# Patient Record
Sex: Female | Born: 1965 | Race: White | Hispanic: Yes | Marital: Single | Smoking: Current every day smoker
Health system: Southern US, Community
[De-identification: ages and names within clinical notes are randomized; demographics above are authoritative.]

## PROBLEM LIST (undated history)

## (undated) DIAGNOSIS — E109 Type 1 diabetes mellitus without complications: Secondary | ICD-10-CM

## (undated) DIAGNOSIS — D8989 Other specified disorders involving the immune mechanism, not elsewhere classified: Secondary | ICD-10-CM

## (undated) DIAGNOSIS — E119 Type 2 diabetes mellitus without complications: Secondary | ICD-10-CM

## (undated) DIAGNOSIS — D649 Anemia, unspecified: Secondary | ICD-10-CM

## (undated) DIAGNOSIS — R609 Edema, unspecified: Secondary | ICD-10-CM

## (undated) DIAGNOSIS — K5190 Ulcerative colitis, unspecified, without complications: Secondary | ICD-10-CM

## (undated) DIAGNOSIS — Z803 Family history of malignant neoplasm of breast: Secondary | ICD-10-CM

## (undated) DIAGNOSIS — G629 Polyneuropathy, unspecified: Secondary | ICD-10-CM

## (undated) DIAGNOSIS — E785 Hyperlipidemia, unspecified: Secondary | ICD-10-CM

## (undated) DIAGNOSIS — I10 Essential (primary) hypertension: Secondary | ICD-10-CM

## (undated) DIAGNOSIS — R600 Localized edema: Secondary | ICD-10-CM

## (undated) DIAGNOSIS — Z8481 Family history of carrier of genetic disease: Secondary | ICD-10-CM

## (undated) HISTORY — DX: Edema, unspecified: R60.9

## (undated) HISTORY — DX: Family history of carrier of genetic disease: Z84.81

## (undated) HISTORY — DX: Localized edema: R60.0

## (undated) HISTORY — DX: Anemia, unspecified: D64.9

## (undated) HISTORY — PX: CHOLECYSTECTOMY: SHX55

## (undated) HISTORY — DX: Morbid (severe) obesity due to excess calories: E66.01

## (undated) HISTORY — DX: Ulcerative colitis, unspecified, without complications: K51.90

## (undated) HISTORY — DX: Hyperlipidemia, unspecified: E78.5

## (undated) HISTORY — DX: Family history of malignant neoplasm of breast: Z80.3

## (undated) HISTORY — DX: Type 1 diabetes mellitus without complications: E10.9

## (undated) HISTORY — DX: Type 2 diabetes mellitus without complications: E11.9

## (undated) HISTORY — DX: Essential (primary) hypertension: I10

## (undated) HISTORY — DX: Polyneuropathy, unspecified: G62.9

## (undated) HISTORY — DX: Other specified disorders involving the immune mechanism, not elsewhere classified: D89.89

---

## 2004-07-11 ENCOUNTER — Ambulatory Visit: Payer: Self-pay | Admitting: Family Medicine

## 2006-11-13 ENCOUNTER — Emergency Department (HOSPITAL_COMMUNITY): Admission: EM | Admit: 2006-11-13 | Discharge: 2006-11-13 | Payer: Self-pay | Admitting: Emergency Medicine

## 2007-08-28 ENCOUNTER — Emergency Department (HOSPITAL_COMMUNITY): Admission: EM | Admit: 2007-08-28 | Discharge: 2007-08-28 | Payer: Self-pay | Admitting: Emergency Medicine

## 2009-11-21 ENCOUNTER — Encounter: Admission: RE | Admit: 2009-11-21 | Discharge: 2009-11-21 | Payer: Self-pay | Admitting: Internal Medicine

## 2010-10-19 LAB — COMPREHENSIVE METABOLIC PANEL
ALT: 13
AST: 17
Albumin: 3.4 — ABNORMAL LOW
Alkaline Phosphatase: 92
BUN: 8
CO2: 25
Calcium: 8.9
Chloride: 102
Creatinine, Ser: 0.92
GFR calc Af Amer: >60
GFR calc non Af Amer: >60
Glucose, Bld: 205 — ABNORMAL HIGH
Potassium: 4.5
Sodium: 138
Total Bilirubin: 0.6
Total Protein: 7.7

## 2010-10-19 LAB — HEPATIC FUNCTION PANEL
ALT: 13
AST: 15
Albumin: 3.2 — ABNORMAL LOW
Alkaline Phosphatase: 88
Bilirubin, Direct: 0.1
Indirect Bilirubin: 0.5
Total Bilirubin: 0.6
Total Protein: 7.5

## 2010-10-19 LAB — DIFFERENTIAL
Basophils Absolute: 0.1
Basophils Relative: 0
Eosinophils Absolute: 0.1
Eosinophils Relative: 1
Lymphocytes Relative: 13
Lymphs Abs: 3
Monocytes Absolute: 0.6
Monocytes Relative: 3
Neutro Abs: 19.1 — ABNORMAL HIGH
Neutrophils Relative %: 84 — ABNORMAL HIGH

## 2010-10-19 LAB — CBC
HCT: 40.9
Hemoglobin: 13.4
MCHC: 32.7
MCV: 81.7
Platelets: 537 — ABNORMAL HIGH
RBC: 5.01
RDW: 15.4
WBC: 22.9 — ABNORMAL HIGH

## 2010-10-19 LAB — SALICYLATE LEVEL: Salicylate Lvl: <4

## 2010-10-19 LAB — POCT CARDIAC MARKERS
CKMB, poc: 2.6
Myoglobin, poc: 169
Troponin i, poc: <0.05

## 2010-10-19 LAB — ACETAMINOPHEN LEVEL
Acetaminophen (Tylenol), Serum: 12.7
Acetaminophen (Tylenol), Serum: <10 — ABNORMAL LOW

## 2010-10-31 LAB — I-STAT 8, (EC8 V) (CONVERTED LAB)
Acid-base deficit: 1
BUN: 6
Bicarbonate: 25.2 — ABNORMAL HIGH
Chloride: 105
Glucose, Bld: 144 — ABNORMAL HIGH
HCT: 41
Hemoglobin: 13.9
Operator id: 146091
Potassium: 4
Sodium: 137
TCO2: 27
pCO2, Ven: 44.6 — ABNORMAL LOW
pH, Ven: 7.361 — ABNORMAL HIGH

## 2010-10-31 LAB — POCT I-STAT CREATININE
Creatinine, Ser: 0.8
Operator id: 146091

## 2010-10-31 LAB — D-DIMER, QUANTITATIVE: D-Dimer, Quant: 0.97 — ABNORMAL HIGH

## 2013-11-04 ENCOUNTER — Ambulatory Visit (INDEPENDENT_AMBULATORY_CARE_PROVIDER_SITE_OTHER): Payer: Medicaid Other

## 2013-11-04 VITALS — BP 105/60 | HR 89 | Resp 12

## 2013-11-04 DIAGNOSIS — L97511 Non-pressure chronic ulcer of other part of right foot limited to breakdown of skin: Secondary | ICD-10-CM

## 2013-11-04 DIAGNOSIS — E1169 Type 2 diabetes mellitus with other specified complication: Secondary | ICD-10-CM | POA: Insufficient documentation

## 2013-11-04 DIAGNOSIS — E1140 Type 2 diabetes mellitus with diabetic neuropathy, unspecified: Secondary | ICD-10-CM

## 2013-11-04 DIAGNOSIS — E1149 Type 2 diabetes mellitus with other diabetic neurological complication: Secondary | ICD-10-CM

## 2013-11-04 DIAGNOSIS — B351 Tinea unguium: Secondary | ICD-10-CM

## 2013-11-04 DIAGNOSIS — M79676 Pain in unspecified toe(s): Secondary | ICD-10-CM

## 2013-11-04 DIAGNOSIS — E785 Hyperlipidemia, unspecified: Secondary | ICD-10-CM

## 2013-11-04 DIAGNOSIS — I10 Essential (primary) hypertension: Secondary | ICD-10-CM

## 2013-11-04 NOTE — Patient Instructions (Signed)
ANTIBACTERIAL SOAP INSTRUCTIONS  THE DAY AFTER PROCEDURE  Please follow the instructions your doctor has marked.   Shower as usual. Before getting out, place a drop of antibacterial liquid soap (Dial) on a wet, clean washcloth.  Gently wipe washcloth over affected area.  Afterward, rinse the area with warm water.  Blot the area dry with a soft cloth and cover with antibiotic ointment (neosporin, polysporin, bacitracin) and band aid or gauze and tape  Place 3-4 drops of antibacterial liquid soap in a quart of warm tap water.  Submerge foot into water for 20 minutes.  If bandage was applied after your procedure, leave on to allow for easy lift off, then remove and continue with soak for the remaining time.  Next, blot area dry with a soft cloth and cover with a bandage.  Apply other medications as directed by your doctor, such as cortisporin otic solution (eardrops) or neosporin antibiotic ointment  Daily cleansing of wound site with soap and water as instructed dry thoroughly apply Silvadene and Band-Aid or gauze dressing daily repeat cleansing and dressing change and Silvadene application daily until the wound has resolved

## 2013-11-04 NOTE — Progress Notes (Signed)
Subjective:    Patient ID: Angela ChimeMichelle R Exley, female    DOB: 1965-03-06, 48 y.o.   MRN: [id number]  HPI  PT STATED B/L UNDER THE GREAT TOE HAVE BLISTER FOR 1 WEEK. THE TOES ARE BEEN THE SAME AND DO NOT HAVE ANY FEELING ON BOTH FEET. TRIED TO USED NEOSPORIN BUT NO HELP.  ALSO TOENAILS HAVE DISCOLORATION/ THICK AND THEY GET SORE.  Review of Systems  HENT: Negative.   Eyes: Negative.   Respiratory: Negative.   Cardiovascular: Positive for leg swelling.  Gastrointestinal: Negative.   Endocrine: Positive for polyuria.  Musculoskeletal: Positive for arthralgias and gait problem.  Skin: Positive for wound.  Allergic/Immunologic: Negative.   Neurological: Positive for weakness and numbness.  Hematological: Negative.   Psychiatric/Behavioral: Negative.        Objective:   Physical Exam  Constitutional: She is oriented to person, place, and time. She appears well-developed and well-nourished.  Cardiovascular: Normal rate.   Musculoskeletal: She exhibits edema.  Neurological: She is alert and oriented to person, place, and time. She has normal reflexes.  Skin: Skin is warm.  Psychiatric: She has a normal mood and affect. Her behavior is normal. Judgment and thought content normal.   lower extremity findings as follows vascular status is diminished with thready DP pulses plus one over 4 bilateral PT nonpalpable bilateral +1 edema bilateral notable varicosities noted neurologically epicritic and proprioceptive sensations grossly diminished absent vibratory sensation the forefoot bilateral absent epicritic sensation in her foot dorsum and ankle area all 10 sites are absent on [facility] there is normal plantar response DTRs not elicited dermatologically skin color pigment normal hair growth absent nails criptotic incurvated friable brittle crumbly discolored and tender both on palpation and angulation. Hallux IP joints bilateral reveal a hemorrhage a keratoses which on debridement of the  left hallux IP joint the ulcer or keratoses is healed beneath no active ulcers noted however there is active ulcer on the right hallux IP joint following debridement. Patient is multiple keratoses sub-5 bilateral as well as the hallux IP joints. Orthopedic biomechanical exam rectus foot type ankle mid tarsus subtalar joint motion is normal semirigid digital contractures are noted there again dermatologically criptotic incurvated friable problem brittle discolored nails 1 through 5 bilateral.        Assessment & Plan:  Assessment this time diabetes with history peripheral neuropathy and angiopathy there is some vascular cover minus patient does have possible symptoms of claudication to walk a certain distance may want to address this with vascular evaluation in the near future there is no acute issue right now however if no healing of the ulcer within the next month again vascular recommendations for evaluation we recommended at this time patient is placed on a regimen of ulcer debridement of the right hallux Silvadene and gauze dressing applied daily cleansing and Silvadene dressing changes are recommended no oral antibiotic and needed there is no ascending size lymphangitis. Nails thick brittle crumbly friable dystrophic in findings his with onychomycosis mycotic nails debrided x10 return for future palliative care is needed there is some ecchymosis of the nail plates nailbeds of the hallux and lesser digits on the left likely associated with her shoe wear she is now wearing socks wearing slip on shoes and she is biting into the end of her shoes with her toes. This time recommendation patient is a candidate for diabetic extra-depth shoes in the interim socks and shoes or socks and slippers are recommended at all times consider Crocs for around  the house. Recheck in 4 weeks for reevaluation of ulcer and future diabetic foot care is needed  Alvan Dameichard Roshaunda Starkey DPM

## 2013-11-23 ENCOUNTER — Encounter: Payer: Self-pay | Admitting: Vascular Surgery

## 2013-11-23 ENCOUNTER — Other Ambulatory Visit: Payer: Self-pay | Admitting: *Deleted

## 2013-11-23 DIAGNOSIS — R0989 Other specified symptoms and signs involving the circulatory and respiratory systems: Secondary | ICD-10-CM

## 2013-11-23 DIAGNOSIS — L97419 Non-pressure chronic ulcer of right heel and midfoot with unspecified severity: Secondary | ICD-10-CM

## 2013-12-02 ENCOUNTER — Ambulatory Visit: Payer: Medicaid Other

## 2013-12-07 ENCOUNTER — Encounter: Payer: Self-pay | Admitting: Vascular Surgery

## 2013-12-08 ENCOUNTER — Encounter: Payer: Medicaid Other | Admitting: Vascular Surgery

## 2013-12-08 ENCOUNTER — Encounter (HOSPITAL_COMMUNITY): Payer: Medicaid Other

## 2013-12-20 ENCOUNTER — Ambulatory Visit (INDEPENDENT_AMBULATORY_CARE_PROVIDER_SITE_OTHER): Payer: Medicaid Other

## 2013-12-20 VITALS — BP 117/66 | HR 92 | Resp 12

## 2013-12-20 DIAGNOSIS — M79676 Pain in unspecified toe(s): Secondary | ICD-10-CM

## 2013-12-20 DIAGNOSIS — E1149 Type 2 diabetes mellitus with other diabetic neurological complication: Secondary | ICD-10-CM

## 2013-12-20 DIAGNOSIS — L97511 Non-pressure chronic ulcer of other part of right foot limited to breakdown of skin: Secondary | ICD-10-CM

## 2013-12-20 DIAGNOSIS — Q828 Other specified congenital malformations of skin: Secondary | ICD-10-CM

## 2013-12-20 DIAGNOSIS — E1140 Type 2 diabetes mellitus with diabetic neuropathy, unspecified: Secondary | ICD-10-CM

## 2013-12-20 NOTE — Progress Notes (Signed)
   Subjective:    Patient ID: Angela Hubbard, female    DOB: [birth year], 48 y.o.   MRN: [id number]  HPI  ''B/L BOTTOM OF THE GREAT TOE BLISTER SKIN GETTING THICKER AND LOOKING WORSE.''  Review of Systems no new findings or systemic changes noted    Objective:   Physical Exam lower extremity objective findings unchanged pedal pulses are palpable there is hemorrhage a keratosis under the hallux IP joints bilateral however on debridement there is minimal pinpoint bleeding on both sides under the hallux IP joint less than 2 mm in overall diameter in each down to dermal level only no purulence no active sign of infection no discharge or drainage are noted. Again pedal pulses are palpable still waiting on diabetic shoes from [employer]. 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 DP plus one over 4 PT thready nonpalpable +1 edema noted x-rays of foot looks and she fairly greatly improved will recheck again in 6 weeks for follow-up and should have her diabetic extra-depth shoes and insoles bilateral time though should be helpful and offloading these pressure areas. Also will use her goal Bond diabetic cream twice daily as instructed  Alvan Dameichard Sade Mehlhoff DPM

## 2013-12-20 NOTE — Patient Instructions (Signed)
Diabetes and Foot Care Diabetes may cause you to have problems because of poor blood supply (circulation) to your feet and legs. This may cause the skin on your feet to become thinner, break easier, and heal more slowly. Your skin may become dry, and the skin may peel and crack. You may also have nerve damage in your legs and feet causing decreased feeling in them. You may not notice minor injuries to your feet that could lead to infections or more serious problems. Taking care of your feet is one of the most important things you can do for yourself.  HOME CARE INSTRUCTIONS  Wear shoes at all times, even in the house. Do not go barefoot. Bare feet are easily injured.  Check your feet daily for blisters, cuts, and redness. If you cannot see the bottom of your feet, use a mirror or ask someone for help.  Wash your feet with warm water (do not use hot water) and mild soap. Then pat your feet and the areas between your toes until they are completely dry. Do not soak your feet as this can dry your skin.  Apply a moisturizing lotion or petroleum jelly (that does not contain alcohol and is unscented) to the skin on your feet and to dry, brittle toenails. Do not apply lotion between your toes.  Trim your toenails straight across. Do not dig under them or around the cuticle. File the edges of your nails with an emery board or nail file.  Do not cut corns or calluses or try to remove them with medicine.  Wear clean socks or stockings every day. Make sure they are not too tight. Do not wear knee-high stockings since they may decrease blood flow to your legs.  Wear shoes that fit properly and have enough cushioning. To break in new shoes, wear them for just a few hours a day. This prevents you from injuring your feet. Always look in your shoes before you put them on to be sure there are no objects inside.  Do not cross your legs. This may decrease the blood flow to your feet.  If you find a minor scrape,  cut, or break in the skin on your feet, keep it and the skin around it clean and dry. These areas may be cleansed with mild soap and water. Do not cleanse the area with peroxide, alcohol, or iodine.  When you remove an adhesive bandage, be sure not to damage the skin around it.  If you have a wound, look at it several times a day to make sure it is healing.  Do not use heating pads or hot water bottles. They may burn your skin. If you have lost feeling in your feet or legs, you may not know it is happening until it is too late.  Make sure your health care provider performs a complete foot exam at least annually or more often if you have foot problems. Report any cuts, sores, or bruises to your health care provider immediately. SEEK MEDICAL CARE IF:   You have an injury that is not healing.  You have cuts or breaks in the skin.  You have an ingrown nail.  You notice redness on your legs or feet.  You feel burning or tingling in your legs or feet.  You have pain or cramps in your legs and feet.  Your legs or feet are numb.  Your feet always feel cold. SEEK IMMEDIATE MEDICAL CARE IF:   There is increasing redness,   swelling, or pain in or around a wound.  There is a red line that goes up your leg.  Pus is coming from a wound.  You develop a fever or as directed by your health care provider.  You notice a bad smell coming from an ulcer or wound. Document Released: 01/05/2000 Document Revised: 09/09/2012 Document Reviewed: 06/16/2012 [facility] Patient Information 2015 [employer] [place]. This information is not intended to replace advice given to you by your health care provider. Make sure you discuss any questions you have with your health care provider.    Apply lotion to feet twice daily as instructed

## 2014-01-06 ENCOUNTER — Encounter: Payer: Self-pay | Admitting: Vascular Surgery

## 2014-01-06 ENCOUNTER — Telehealth: Payer: Self-pay

## 2014-01-06 NOTE — Telephone Encounter (Signed)
Debra from [employer] called about this patient she needs some type of medicaid form sent to her

## 2014-01-07 ENCOUNTER — Encounter: Payer: Medicaid Other | Admitting: Vascular Surgery

## 2014-01-07 ENCOUNTER — Encounter (HOSPITAL_COMMUNITY): Payer: Medicaid Other

## 2014-01-07 NOTE — Telephone Encounter (Signed)
PT NEED SOME TYPE OF MEDICAID FORM TO [employer]

## 2014-01-19 ENCOUNTER — Encounter: Payer: Medicaid Other | Admitting: Vascular Surgery

## 2014-01-19 ENCOUNTER — Encounter (HOSPITAL_COMMUNITY): Payer: Medicaid Other

## 2014-01-31 ENCOUNTER — Ambulatory Visit: Payer: Medicaid Other

## 2014-02-02 ENCOUNTER — Encounter: Payer: Medicaid Other | Admitting: Vascular Surgery

## 2014-02-02 ENCOUNTER — Encounter (HOSPITAL_COMMUNITY): Payer: Medicaid Other

## 2014-02-07 ENCOUNTER — Encounter (HOSPITAL_COMMUNITY): Payer: Medicaid Other

## 2014-02-07 ENCOUNTER — Encounter: Payer: Medicaid Other | Admitting: Surgery

## 2014-02-23 ENCOUNTER — Encounter: Payer: Self-pay | Admitting: Vascular Surgery

## 2014-02-25 ENCOUNTER — Encounter: Payer: Self-pay | Admitting: Vascular Surgery

## 2014-02-25 ENCOUNTER — Ambulatory Visit (HOSPITAL_COMMUNITY)
Admission: RE | Admit: 2014-02-25 | Discharge: 2014-02-25 | Disposition: A | Discharge status: Home / Self Care | Payer: Medicaid Other | Source: Ambulatory Visit | Attending: Vascular Surgery | Admitting: Vascular Surgery

## 2014-02-25 ENCOUNTER — Ambulatory Visit (INDEPENDENT_AMBULATORY_CARE_PROVIDER_SITE_OTHER): Payer: Medicaid Other | Admitting: Vascular Surgery

## 2014-02-25 VITALS — BP 128/64 | HR 89 | Ht 68.0 in | Wt 393.0 lb

## 2014-02-25 DIAGNOSIS — E119 Type 2 diabetes mellitus without complications: Secondary | ICD-10-CM | POA: Insufficient documentation

## 2014-02-25 DIAGNOSIS — L97419 Non-pressure chronic ulcer of right heel and midfoot with unspecified severity: Secondary | ICD-10-CM

## 2014-02-25 DIAGNOSIS — L84 Corns and callosities: Secondary | ICD-10-CM

## 2014-02-25 DIAGNOSIS — R0989 Other specified symptoms and signs involving the circulatory and respiratory systems: Secondary | ICD-10-CM | POA: Diagnosis present

## 2014-02-25 DIAGNOSIS — I10 Essential (primary) hypertension: Secondary | ICD-10-CM | POA: Insufficient documentation

## 2014-02-25 NOTE — Progress Notes (Signed)
Referred by:  Cain Saupe, MD [address] [place], [place] [address]  Reason for referral: ulcers in feet  History of Present Illness  Angela Hubbard is a 49 y.o. ([birth year]) female who presents with chief complaint: "was told I have no pulses."  Pt was referred to Podiatry for evaluation and reported podiatry felt she had PAD.  Her "ulcers" are calluses per the patient.  She is getting those periodically debulked.  She denies intermittent claudication or rest pain.  Her ambulation is limited.  She does note bilateral leg swelling.  She has mild CVI sx and has not tried compression stockings.  She has no family history of venous disorders.  She has had children in the past and denies DVT history.   Past Medical History  Diagnosis Date  . Diabetes mellitus without complication   . Neuropathy   . Hypertension   . Hyperlipidemia   . Morbid obesity   . Peripheral edema     chronic  . Anemia     Past Surgical History  Procedure Laterality Date  . Cholecystectomy      History   Social History  . Marital Status: Unknown    Spouse Name: N/A    Number of Children: N/A  . Years of Education: N/A   Occupational History  . Not on file.   Social History Main Topics  . Smoking status: Current Every Day Smoker -- 0.50 packs/day for 30 years    Types: Cigarettes  . Smokeless tobacco: Not on file  . Alcohol Use: No  . Drug Use: No  . Sexual Activity: Not on file   Other Topics Concern  . Not on file   Social History Narrative    Family History  Problem Relation Age of Onset  . Hypertension Father   . Diabetes Father   . Hyperlipidemia Father   . Peripheral vascular disease Father     Current Outpatient Prescriptions on File Prior to Visit  Medication Sig Dispense Refill  . albuterol (PROVENTIL HFA;VENTOLIN HFA) 108 (90 BASE) MCG/ACT inhaler Inhale into the lungs every 6 (six) hours as needed for wheezing or shortness of breath.    Marland Kitchen amitriptyline  (ELAVIL) 50 MG tablet Take 50 mg by mouth at bedtime.    Marland Kitchen aspirin 81 MG tablet Take 81 mg by mouth daily.    Marland Kitchen atorvastatin (LIPITOR) 80 MG tablet Take 80 mg by mouth daily.    . furosemide (LASIX) 40 MG tablet Take 40 mg by mouth.    Marland Kitchen gemfibrozil (LOPID) 600 MG tablet Take 600 mg by mouth 2 (two) times daily before a meal.    . HYDROcodone-acetaminophen (NORCO) 7.5-325 MG per tablet Take 1 tablet by mouth every 6 (six) hours as needed for moderate pain.    Marland Kitchen insulin NPH-regular Human (NOVOLIN 70/30) (70-30) 100 UNIT/ML injection Inject into the skin.    Marland Kitchen levothyroxine (SYNTHROID, LEVOTHROID) 50 MCG tablet Take 50 mcg by mouth daily before breakfast.    . lisinopril (PRINIVIL,ZESTRIL) 20 MG tablet Take 20 mg by mouth daily.    . metFORMIN (GLUCOPHAGE) 1000 MG tablet Take 1,000 mg by mouth 2 (two) times daily with a meal.    . metoprolol succinate (TOPROL-XL) 25 MG 24 hr tablet Take 25 mg by mouth daily.    . Multiple Vitamin (MULTI VITAMIN DAILY PO) Take by mouth.    Marland Kitchen omeprazole (PRILOSEC) 40 MG capsule Take 40 mg by mouth daily.    . pregabalin (  LYRICA) 150 MG capsule Take 150 mg by mouth 2 (two) times daily.     No current facility-administered medications on file prior to visit.    No Known Allergies  REVIEW OF SYSTEMS:  (Positives checked otherwise negative)  CARDIOVASCULAR:   chest pain,  chest pressure,  palpitations,  shortness of breath when laying flat,  shortness of breath with exertion,    pain in feet when walking,  pain in feet when laying flat,  history of blood clot in veins (DVT),  history of phlebitis,  swelling in legs,  varicose veins  PULMONARY:   productive cough,  asthma,  wheezing  NEUROLOGIC:   weakness in arms or legs,  numbness in arms or legs,  difficulty speaking or slurred speech,  temporary loss of vision in one eye,  dizziness  HEMATOLOGIC:   bleeding problems,  problems with blood clotting  too easily  MUSCULOSKEL:   joint pain,  joint swelling  GASTROINTEST:    Vomiting blood,   Blood in stool     GENITOURINARY:    Burning with urination,   Blood in urine  PSYCHIATRIC:   history of major depression  INTEGUMENTARY:   rashes,  ulcers  CONSTITUTIONAL:   fever,  chills  For VQI Use Only   PRE-ADM LIVING: Home  AMB STATUS: Ambulatory  CAD Sx: None  PRIOR CHF: None  STRESS TEST:  No,  Normal,  + ischemia,  + MI,  Both   Physical Examination Filed Vitals:   02/25/14 0938  BP: 128/64  Pulse: 89  Height:  (1.727 m)  Weight: 393 lb (178.264 kg)  SpO2: 98%   Body mass index is 59.77 kg/(m^2).  General: A&O x 3, WDWN, morbidly obese  Head: [place]/AT  Ear/Nose/Throat: Hearing grossly intact, nares w/o erythema or drainage, oropharynx w/o Erythema/Exudate  Eyes: PERRLA, EOMI  Neck: Supple, no nuchal rigidity, no palpable LAD  Pulmonary: Sym exp, good air movt, CTAB, no rales, rhonchi, & wheezing  Cardiac: RRR, Nl S1, S2, no Murmurs, rubs or gallops  Vascular: Vessel Right Left  Radial Palpable Palpable  Brachial  Palpable Palpable  Carotid Palpable, without bruit Palpable, without bruit  Aorta Not palpable due to obesity N/A  Femoral Not Palpable due to pannus overlying femorals Not Palpable due to pannus overlying femorals  Popliteal Not palpable Not palpable  PT Faintly Palpable Faintly Palpable  DP Palpable Palpable   Gastrointestinal: soft, NTND, -G/R, - HSM, - masses, - CVAT B, large pannus  Musculoskeletal: M/S 5/5 throughout , Extremities without ischemic changes , lipedema fat distribution, calluses overly great proximal phlanage/MT joint  Neurologic: CN 2-12 intact , Pain and light touch intact in extremities , Motor exam as listed above  Psychiatric: Judgment intact, Mood & affect appropriatefor pt's clinical situation  Dermatologic: See M/S exam for extremity exam, no rashes otherwise  noted  Lymph : No Cervical, Axillary, or Inguinal lymphadenopathy   Non-Invasive Vascular Imaging  ABI (Date: 02/25/2014)  R: 1.11, DP: tri, PT: tri, TBI: 1.04  L: 1.04, DP: tri, PT: tri, TBI: 1.12  Outside Studies/Documentation 4 pages of outside documents were reviewed including: outpatient PCP chart.  Medical Decision Making  Angela Hubbard is a 49 y.o. female who presents with: B calluses, no evidence of PAD, likely mild CVI   This  patient has no evidence of PAD currently though she definitely has risk factorsfor such. She does not have any ulcers or ischemic lesions.  I doubt this patient can fit into 20-30 mm Hg compression stockings so I would stick with OTC stockings.  Obvious emphasis on dietary issues are going to be essential to avoid development of atherosclerotic sequelae since her morbidity obesity is going to make any intervention very difficult.  Thank you for allowing us to participate in this patient's care.  Leonides SakeBrian Mairen Wallenstein, MD [facility] Office: [phone] Pager: [phone]  02/25/2014, 9:55 AM

## 2015-05-17 ENCOUNTER — Ambulatory Visit: Payer: Medicaid Other | Admitting: Sports Medicine

## 2015-05-18 ENCOUNTER — Ambulatory Visit: Payer: Medicaid Other | Admitting: Sports Medicine

## 2015-08-24 DIAGNOSIS — E7800 Pure hypercholesterolemia, unspecified: Secondary | ICD-10-CM | POA: Insufficient documentation

## 2015-08-24 DIAGNOSIS — K219 Gastro-esophageal reflux disease without esophagitis: Secondary | ICD-10-CM | POA: Insufficient documentation

## 2015-08-26 ENCOUNTER — Other Ambulatory Visit: Payer: Self-pay | Admitting: Surgical Oncology

## 2015-08-26 DIAGNOSIS — I159 Secondary hypertension, unspecified: Secondary | ICD-10-CM

## 2015-09-12 ENCOUNTER — Ambulatory Visit
Admission: RE | Admit: 2015-09-12 | Discharge: 2015-09-12 | Disposition: A | Discharge status: Home / Self Care | Payer: Medicaid Other | Source: Ambulatory Visit | Attending: Surgical Oncology | Admitting: Surgical Oncology

## 2015-09-12 DIAGNOSIS — I159 Secondary hypertension, unspecified: Secondary | ICD-10-CM

## 2015-12-18 DIAGNOSIS — Z9884 Bariatric surgery status: Secondary | ICD-10-CM | POA: Insufficient documentation

## 2016-11-05 ENCOUNTER — Other Ambulatory Visit: Payer: Self-pay | Admitting: Family Medicine

## 2016-11-05 ENCOUNTER — Other Ambulatory Visit (HOSPITAL_COMMUNITY)
Admission: RE | Admit: 2016-11-05 | Discharge: 2016-11-05 | Disposition: A | Discharge status: Home / Self Care | Payer: Medicaid Other | Source: Ambulatory Visit | Attending: Family Medicine | Admitting: Family Medicine

## 2016-11-05 DIAGNOSIS — Z124 Encounter for screening for malignant neoplasm of cervix: Secondary | ICD-10-CM | POA: Insufficient documentation

## 2016-11-06 ENCOUNTER — Other Ambulatory Visit: Payer: Self-pay | Admitting: Family Medicine

## 2016-11-06 DIAGNOSIS — N6310 Unspecified lump in the right breast, unspecified quadrant: Secondary | ICD-10-CM

## 2016-11-06 DIAGNOSIS — R102 Pelvic and perineal pain: Secondary | ICD-10-CM

## 2016-11-07 LAB — CYTOLOGY - PAP: Diagnosis: NEGATIVE

## 2016-11-12 ENCOUNTER — Ambulatory Visit
Admission: RE | Admit: 2016-11-12 | Discharge: 2016-11-12 | Disposition: A | Discharge status: Home / Self Care | Payer: Medicaid Other | Source: Ambulatory Visit | Attending: Family Medicine | Admitting: Family Medicine

## 2016-11-12 DIAGNOSIS — R102 Pelvic and perineal pain: Secondary | ICD-10-CM

## 2016-11-13 ENCOUNTER — Ambulatory Visit
Admission: RE | Admit: 2016-11-13 | Discharge: 2016-11-13 | Disposition: A | Discharge status: Home / Self Care | Payer: Medicaid Other | Source: Ambulatory Visit | Attending: Family Medicine | Admitting: Family Medicine

## 2016-11-13 ENCOUNTER — Other Ambulatory Visit: Payer: Self-pay | Admitting: Family Medicine

## 2016-11-13 DIAGNOSIS — N6310 Unspecified lump in the right breast, unspecified quadrant: Secondary | ICD-10-CM

## 2016-11-22 ENCOUNTER — Other Ambulatory Visit: Payer: Self-pay | Admitting: Family Medicine

## 2016-11-22 DIAGNOSIS — R1032 Left lower quadrant pain: Secondary | ICD-10-CM

## 2016-11-28 ENCOUNTER — Ambulatory Visit
Admission: RE | Admit: 2016-11-28 | Discharge: 2016-11-28 | Disposition: A | Discharge status: Home / Self Care | Payer: Medicaid Other | Source: Ambulatory Visit | Attending: Family Medicine | Admitting: Family Medicine

## 2016-11-28 DIAGNOSIS — R1032 Left lower quadrant pain: Secondary | ICD-10-CM

## 2016-11-28 MED ORDER — IOPAMIDOL (ISOVUE-300) INJECTION 61%
100.0000 mL | Freq: Once | INTRAVENOUS | Status: AC | PRN
  Administered 2016-11-28: 125 mL via INTRAVENOUS

## 2016-12-28 ENCOUNTER — Other Ambulatory Visit: Payer: Self-pay | Admitting: Gastroenterology

## 2016-12-28 DIAGNOSIS — K529 Noninfective gastroenteritis and colitis, unspecified: Secondary | ICD-10-CM

## 2016-12-30 ENCOUNTER — Encounter: Payer: Medicaid Other | Admitting: Genetic Counselor

## 2017-01-03 ENCOUNTER — Ambulatory Visit
Admission: RE | Admit: 2017-01-03 | Discharge: 2017-01-03 | Disposition: A | Discharge status: Home / Self Care | Payer: Medicaid Other | Source: Ambulatory Visit | Attending: Gastroenterology | Admitting: Gastroenterology

## 2017-01-03 DIAGNOSIS — K529 Noninfective gastroenteritis and colitis, unspecified: Secondary | ICD-10-CM

## 2017-01-03 MED ORDER — IOPAMIDOL (ISOVUE-300) INJECTION 61%
100.0000 mL | Freq: Once | INTRAVENOUS | Status: AC | PRN
  Administered 2017-01-03: 100 mL via INTRAVENOUS

## 2017-01-06 ENCOUNTER — Encounter: Payer: Self-pay | Admitting: Genetic Counselor

## 2017-01-06 ENCOUNTER — Other Ambulatory Visit: Payer: Medicaid Other

## 2017-01-06 ENCOUNTER — Ambulatory Visit (HOSPITAL_BASED_OUTPATIENT_CLINIC_OR_DEPARTMENT_OTHER): Payer: Medicaid Other | Admitting: Genetic Counselor

## 2017-01-06 DIAGNOSIS — Z8481 Family history of carrier of genetic disease: Secondary | ICD-10-CM | POA: Diagnosis not present

## 2017-01-06 DIAGNOSIS — Z803 Family history of malignant neoplasm of breast: Secondary | ICD-10-CM | POA: Diagnosis present

## 2017-01-06 DIAGNOSIS — D8989 Other specified disorders involving the immune mechanism, not elsewhere classified: Secondary | ICD-10-CM | POA: Insufficient documentation

## 2017-01-06 DIAGNOSIS — Z7183 Encounter for nonprocreative genetic counseling: Secondary | ICD-10-CM

## 2017-01-06 DIAGNOSIS — E10610 Type 1 diabetes mellitus with diabetic neuropathic arthropathy: Secondary | ICD-10-CM

## 2017-01-06 DIAGNOSIS — K51919 Ulcerative colitis, unspecified with unspecified complications: Secondary | ICD-10-CM | POA: Diagnosis not present

## 2017-01-06 DIAGNOSIS — K5190 Ulcerative colitis, unspecified, without complications: Secondary | ICD-10-CM | POA: Insufficient documentation

## 2017-01-06 DIAGNOSIS — E109 Type 1 diabetes mellitus without complications: Secondary | ICD-10-CM | POA: Insufficient documentation

## 2017-01-06 NOTE — Progress Notes (Signed)
REFERRING PROVIDER: Aretta Nip, MD [place], [place] [id number]  PRIMARY PROVIDER:  Antony Blackbird, MD (Inactive)  PRIMARY REASON FOR VISIT:  1. Family history of breast cancer   2. Family history of BRCA2 gene positive   3. 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 Autoimmune disorder ([place])   5. Ulcerative colitis with complication, unspecified location [facility])      HISTORY OF PRESENT ILLNESS:   Angela Hubbard, a 51 y.o. female, was seen for a  cancer genetics consultation at the request of Angela Hubbard due to a family history of cancer and a known BRCA2 mutation.  Angela Hubbard presents to clinic today to discuss the possibility of a hereditary predisposition to cancer, genetic testing, and to further clarify her future cancer risks, as well as potential cancer risks for family members. Angela Hubbard is a 51 y.o. female with no personal history of cancer.  She has several autoimmune diseases, one that causes mouth blisters, another that has caused an open sore on her hip, and more recently ulcerative colitis.  Her maternal half sister was diagnosed with breast cancer and found to carry a BRCA2 mutation.  CANCER HISTORY:   No history exists.     HORMONAL RISK FACTORS:  Menarche was at age 15.  First live birth at age 49.  OCP use for approximately 1-2 years.  Ovaries intact: yes.  Hysterectomy: no.  Menopausal status: premenopausal.  HRT use: 0 years. Colonoscopy: yes; a few polyps. Mammogram within the last year: yes. Number of breast biopsies: 0. Up to date with pelvic exams:  yes. Any excessive radiation exposure in the past:  no  Past Medical History:  Diagnosis Date  . Anemia   . Autoimmune disorder ([place])   . Diabetes mellitus without complication ([place])   . Family history of BRCA2 gene positive   . Family history of breast cancer   . Hyperlipidemia   . Hypertension   . Morbid obesity ([place])   . Neuropathy   .  Peripheral edema    chronic  . Type 1 diabetes ([place])   . Ulcerative colitis [facility])     Past Surgical History:  Procedure Laterality Date  . CHOLECYSTECTOMY      Social History   Socioeconomic History  . Marital status: Single    Spouse name: Not on file  . Number of children: Not on file  . Years of education: Not on file  . Highest education level: Not on file  Social Needs  . Financial resource strain: Not on file  . Food insecurity - worry: Not on file  . Food insecurity - inability: Not on file  . Transportation needs - medical: Not on file  . Transportation needs - non-medical: Not on file  Occupational History  . Not on file  Tobacco Use  . Smoking status: Current Every Day Smoker    Packs/day: 0.50    Years: 30.00    Pack years: 15.00    Types: Cigarettes  Substance and Sexual Activity  . Alcohol use: No    Alcohol/week: 0.0 oz  . Drug use: No  . Sexual activity: Not on file  Other Topics Concern  . Not on file  Social History Narrative  . Not on file     FAMILY HISTORY:  We obtained a detailed, 4-generation family history.  Significant diagnoses are listed below: Family History  Problem Relation Age of Onset  . Hypertension Father   .  Diabetes Father   . Hyperlipidemia Father   . Peripheral vascular disease Father   . Liver cancer Father   . Alcohol abuse Mother   . Drug abuse Mother   . BRCA 1/2 Sister   . Breast cancer Sister 29       maternal half sister  . Throat cancer Maternal Uncle   . Parkinson's disease Maternal Grandmother   . Diabetes Paternal Grandfather     The patient has three sons who are cancer free.  She has two paternal half brothers, whom she is not close and is not aware that they have cancer.  She has three maternal half siblings, two brothers and a sister.  One brother and sister are full siblings to each other and the sister was found to have a BRCA2 mutation.  Both parents are deceased.  The patient's mother died at 82  from complications of alcohol and drug abuse.  She had two full brothers and a maternal half brother and sister.  One half brother is deceased from throat cancer.  Both maternal grandparents are deceased.  The grandmother died of complications of Parkinson's disease.  Her mother had brain cancer.  The grandfather died of unknown causes, and no information is known about his family.  The patient's father died from liver cancer.  He had three brothers and two sisters.  One sister has Down syndrome.  There is no known cancer history on this side of the family.  Patient's maternal ancestors are of [place] descent, and paternal ancestors are of [place] descent. There is no reported Ashkenazi Jewish ancestry. There is no known consanguinity.  GENETIC COUNSELING ASSESSMENT: Angela Hubbard is a 51 y.o. female with a family history of BRCA mutation which is somewhat suggestive of a hereditary breast and ovarian cancer syndrome and predisposition to cancer. We, therefore, discussed and recommended the following at today's visit.   DISCUSSION: We discussed that about 5-10% of breast cancer is hereditary with most cases due to BRCA mutations.  The patient's maternal half sister was found to have a BRCA2 mutation.  This places the patient at a 25% chance of having the same mutation.  We discussed who in the family should consider genetic testing, including each of the patient's maternal half brothers and her sister's children.  If the patient tests positive, then the relatives on her mother's side of the family should be warned.  We reviewed the characteristics, features and inheritance patterns of hereditary cancer syndromes. We also discussed genetic testing, including the appropriate family members to test, the process of testing, insurance coverage and turn-around-time for results. We discussed the implications of a negative, positive and/or variant of uncertain significant result. We recommended Angela Hubbard pursue  genetic testing for the common hereditary cancer gene panel. The Hereditary Gene Panel offered by [facility] includes sequencing and/or deletion duplication testing of the following 47 genes: APC, ATM, AXIN2, BARD1, BMPR1A, BRCA1, BRCA2, BRIP1, CDH1, CDK4, CDKN2A (p14ARF), CDKN2A (p16INK4a), CHEK2, CTNNA1, DICER1, EPCAM (Deletion/duplication testing only), GREM1 (promoter region deletion/duplication testing only), KIT, MEN1, MLH1, MSH2, MSH3, MSH6, MUTYH, NBN, NF1, NHTL1, PALB2, PDGFRA, PMS2, POLD1, POLE, PTEN, RAD50, RAD51C, RAD51D, SDHB, SDHC, SDHD, SMAD4, SMARCA4. STK11, TP53, TSC1, TSC2, and VHL.  The following genes were evaluated for sequence changes only: SDHA and HOXB13 c.251G>A variant only.   Based on Angela Hubbard's family history of cancer, she meets medical criteria for genetic testing. Despite that she meets criteria, she may still have an out of pocket cost. We discussed  that if her out of pocket cost for testing is over $100, the laboratory will call and confirm whether she wants to proceed with testing.  If the out of pocket cost of testing is less than $100 she will be billed by the genetic testing laboratory.   PLAN: After considering the risks, benefits, and limitations, Angela Hubbard  provided informed consent to pursue genetic testing and the blood sample was sent to [facility] for analysis of the common hereditary cancer panel. Results should be available within approximately 23 weeks' time, at which point they will be disclosed by telephone to Angela Hubbard, as will any additional recommendations warranted by these results. Angela Hubbard will receive a summary of her genetic counseling visit and a copy of her results once available. This information will also be available in Epic. We encouraged Angela Hubbard to remain in contact with cancer genetics annually so that we can continuously update the family history and inform her of any changes in cancer genetics and testing that may be of benefit for her  family. Angela Hubbard questions were answered to her satisfaction today. Our contact information was provided should additional questions or concerns arise.  Lastly, we encouraged Angela Hubbard to remain in contact with cancer genetics annually so that we can continuously update the family history and inform her of any changes in cancer genetics and testing that may be of benefit for this family.   Ms.  Hubbard questions were answered to her satisfaction today. Our contact information was provided should additional questions or concerns arise. Thank you for the referral and allowing [place] to share in the care of your patient.   Karen P. Florene Glen, [place], [facility] Certified Genetic Counselor [email or web address] phone: [phone]  The patient was seen for a total of 45 minutes in face-to-face genetic counseling.  This patient was discussed with Drs. Magrinat, Lindi Adie and/or Burr Medico who agrees with the above.    _______________________________________________________________________ For Office Staff:  Number of people involved in session: 1 Was an Intern/ student involved with case: no

## 2017-01-22 ENCOUNTER — Ambulatory Visit: Payer: Self-pay | Admitting: Genetic Counselor

## 2017-01-22 ENCOUNTER — Telehealth: Payer: Self-pay | Admitting: Genetic Counselor

## 2017-01-22 ENCOUNTER — Encounter: Payer: Self-pay | Admitting: Genetic Counselor

## 2017-01-22 DIAGNOSIS — Z1379 Encounter for other screening for genetic and chromosomal anomalies: Secondary | ICD-10-CM

## 2017-01-22 DIAGNOSIS — Z803 Family history of malignant neoplasm of breast: Secondary | ICD-10-CM

## 2017-01-22 DIAGNOSIS — Z8481 Family history of carrier of genetic disease: Secondary | ICD-10-CM

## 2017-01-22 NOTE — Telephone Encounter (Signed)
Revealed negative genetic testing.  This is a true negative.  Her risk for a BRCA2 cancer is not greater than average.  Discussed that VHL VUS was identified. We will not change medical management based on this result.

## 2017-01-22 NOTE — Progress Notes (Signed)
HPI: Ms. Jahn was previously seen in the [place] clinic due to a family history of cancer and concerns regarding a hereditary predisposition to cancer. Please refer to our prior cancer genetics clinic note for more information regarding Ms. Kollman's medical, social and family histories, and our assessment and recommendations, at the time. Ms. Srinivasan recent genetic test results were disclosed to her, as were recommendations warranted by these results. These results and recommendations are discussed in more detail below.  CANCER HISTORY:   No history exists.    FAMILY HISTORY:  We obtained a detailed, 4-generation family history.  Significant diagnoses are listed below: Family History  Problem Relation Age of Onset  . Hypertension Father   . Diabetes Father   . Hyperlipidemia Father   . Peripheral vascular disease Father   . Liver cancer Father   . Alcohol abuse Mother   . Drug abuse Mother   . BRCA 1/2 Sister   . Breast cancer Sister 2       maternal half sister  . Throat cancer Maternal Uncle   . Parkinson's disease Maternal Grandmother   . Diabetes Paternal Grandfather     The patient has three sons who are cancer free.  She has two paternal half brothers, whom she is not close and is not aware that they have cancer.  She has three maternal half siblings, two brothers and a sister.  One brother and sister are full siblings to each other and the sister was found to have a BRCA2 mutation.  Both parents are deceased.  The patient's mother died at 39 from complications of alcohol and drug abuse.  She had two full brothers and a maternal half brother and sister.  One half brother is deceased from throat cancer.  Both maternal grandparents are deceased.  The grandmother died of complications of Parkinson's disease.  Her mother had brain cancer.  The grandfather died of unknown causes, and no information is known about his family.  The patient's father died from liver  cancer.  He had three brothers and two sisters.  One sister has Down syndrome.  There is no known cancer history on this side of the family.  Patient's maternal ancestors are of [place] descent, and paternal ancestors are of [place] descent. There is no reported Ashkenazi Jewish ancestry. There is no known consanguinity.  GENETIC TEST RESULTS: Genetic testing reported out on January 23, 2016 through the common hereditary cancer panel found no deleterious mutations.  The Hereditary Gene Panel offered by [facility] includes sequencing and/or deletion duplication testing of the following 47 genes: APC, ATM, AXIN2, BARD1, BMPR1A, BRCA1, BRCA2, BRIP1, CDH1, CDK4, CDKN2A (p14ARF), CDKN2A (p16INK4a), CHEK2, CTNNA1, DICER1, EPCAM (Deletion/duplication testing only), GREM1 (promoter region deletion/duplication testing only), KIT, MEN1, MLH1, MSH2, MSH3, MSH6, MUTYH, NBN, NF1, NHTL1, PALB2, PDGFRA, PMS2, POLD1, POLE, PTEN, RAD50, RAD51C, RAD51D, SDHB, SDHC, SDHD, SMAD4, SMARCA4. STK11, TP53, TSC1, TSC2, and VHL.  The following genes were evaluated for sequence changes only: SDHA and HOXB13 c.251G>A variant only.  We call this result a true negative result because the cancer-causing mutation was identified in Ms. Fredell's family, and she did not inherit it.  Given this negative result, Ms. Wingrove's chances of developing BRCA2-related cancers are the same as they are in the general population.  The test report has been scanned into EPIC and is located under the Molecular Pathology section of the Results Review tab.   We discussed with Ms. Santoyo that since the current genetic testing  is not perfect, it is possible there may be a gene mutation in one of these genes that current testing cannot detect, but that chance is small. We also discussed, that it is possible that another gene that has not yet been discovered, or that we have not yet tested, is responsible for the cancer diagnoses in the family, and it is, therefore,  important to remain in touch with cancer genetics in the future so that we can continue to offer Ms. Seitzinger the most up to date genetic testing.   CANCER SCREENING RECOMMENDATIONS:  This normal result is reassuring and indicates that Ms. Kassing does not likely have an increased risk of cancer due to a mutation in one of these genes.  We, therefore, recommended  Ms. Shakespeare continue to follow the cancer screening guidelines provided by her primary healthcare providers.   RECOMMENDATIONS FOR FAMILY MEMBERS: Women in this family might be at some increased risk of developing cancer, over the general population risk, simply due to the family history of cancer. We recommended women in this family have a yearly mammogram beginning at age 14, or 63 years younger than the earliest onset of cancer, an annual clinical breast exam, and perform monthly breast self-exams. Women in this family should also have a gynecological exam as recommended by their primary provider. All family members should have a colonoscopy by age 65.  FOLLOW-UP: Lastly, we discussed with Ms. Eichelberger that cancer genetics is a rapidly advancing field and it is possible that new genetic tests will be appropriate for her and/or her family members in the future. We encouraged her to remain in contact with cancer genetics on an annual basis so we can update her personal and family histories and let her know of advances in cancer genetics that may benefit this family.   Our contact number was provided. Ms. Filippi questions were answered to her satisfaction, and she knows she is welcome to call us at anytime with additional questions or concerns.   Roma Kayser, MS, [facility] Certified Genetic Counselor [email or web address]

## 2017-05-15 ENCOUNTER — Ambulatory Visit: Payer: Medicaid Other

## 2017-05-15 ENCOUNTER — Ambulatory Visit
Admission: RE | Admit: 2017-05-15 | Discharge: 2017-05-15 | Disposition: A | Discharge status: Home / Self Care | Payer: Medicaid Other | Source: Ambulatory Visit | Attending: Family Medicine | Admitting: Family Medicine

## 2017-05-15 DIAGNOSIS — N6310 Unspecified lump in the right breast, unspecified quadrant: Secondary | ICD-10-CM

## 2017-10-03 ENCOUNTER — Other Ambulatory Visit: Payer: Self-pay | Admitting: Family Medicine

## 2017-10-03 DIAGNOSIS — Z1231 Encounter for screening mammogram for malignant neoplasm of breast: Secondary | ICD-10-CM

## 2017-10-16 DIAGNOSIS — E039 Hypothyroidism, unspecified: Secondary | ICD-10-CM | POA: Insufficient documentation

## 2017-11-14 ENCOUNTER — Ambulatory Visit
Admission: RE | Admit: 2017-11-14 | Discharge: 2017-11-14 | Disposition: A | Discharge status: Home / Self Care | Payer: Medicaid Other | Source: Ambulatory Visit | Attending: Family Medicine | Admitting: Family Medicine

## 2017-11-14 DIAGNOSIS — Z1231 Encounter for screening mammogram for malignant neoplasm of breast: Secondary | ICD-10-CM

## 2018-02-03 ENCOUNTER — Emergency Department
Admission: EM | Admit: 2018-02-03 | Discharge: 2018-02-03 | Disposition: A | Discharge status: Home / Self Care | Payer: Medicaid Other | Attending: Emergency Medicine | Admitting: Emergency Medicine

## 2018-02-03 ENCOUNTER — Other Ambulatory Visit: Payer: Self-pay

## 2018-02-03 DIAGNOSIS — L97529 Non-pressure chronic ulcer of other part of left foot with unspecified severity: Secondary | ICD-10-CM | POA: Diagnosis not present

## 2018-02-03 DIAGNOSIS — L739 Follicular disorder, unspecified: Secondary | ICD-10-CM

## 2018-02-03 DIAGNOSIS — L989 Disorder of the skin and subcutaneous tissue, unspecified: Secondary | ICD-10-CM | POA: Diagnosis present

## 2018-02-03 DIAGNOSIS — L0291 Cutaneous abscess, unspecified: Secondary | ICD-10-CM

## 2018-02-03 DIAGNOSIS — Z7982 Long term (current) use of aspirin: Secondary | ICD-10-CM | POA: Insufficient documentation

## 2018-02-03 DIAGNOSIS — E10621 Type 1 diabetes mellitus with foot ulcer: Secondary | ICD-10-CM | POA: Diagnosis not present

## 2018-02-03 DIAGNOSIS — F17210 Nicotine dependence, cigarettes, uncomplicated: Secondary | ICD-10-CM | POA: Insufficient documentation

## 2018-02-03 DIAGNOSIS — Z79899 Other long term (current) drug therapy: Secondary | ICD-10-CM | POA: Insufficient documentation

## 2018-02-03 DIAGNOSIS — L02811 Cutaneous abscess of head [any part, except face]: Secondary | ICD-10-CM | POA: Diagnosis not present

## 2018-02-03 LAB — CBC WITH DIFFERENTIAL/PLATELET
Abs Immature Granulocytes: 0.05 10*3/uL (ref 0.00–0.07)
Basophils Absolute: 0 10*3/uL (ref 0.0–0.1)
Basophils Relative: 0 %
Eosinophils Absolute: 0.1 10*3/uL (ref 0.0–0.5)
Eosinophils Relative: 1 %
HCT: 39.4 % (ref 36.0–46.0)
Hemoglobin: 12.5 g/dL (ref 12.0–15.0)
Immature Granulocytes: 1 %
Lymphocytes Relative: 19 %
Lymphs Abs: 2 10*3/uL (ref 0.7–4.0)
MCH: 28.9 pg (ref 26.0–34.0)
MCHC: 31.7 g/dL (ref 30.0–36.0)
MCV: 91 fL (ref 80.0–100.0)
Monocytes Absolute: 0.6 10*3/uL (ref 0.1–1.0)
Monocytes Relative: 6 %
Neutro Abs: 7.7 10*3/uL (ref 1.7–7.7)
Neutrophils Relative %: 73 %
Platelets: 300 10*3/uL (ref 150–400)
RBC: 4.33 MIL/uL (ref 3.87–5.11)
RDW: 13.5 % (ref 11.5–15.5)
WBC: 10.4 10*3/uL (ref 4.0–10.5)
nRBC: 0 % (ref 0.0–0.2)

## 2018-02-03 LAB — COMPREHENSIVE METABOLIC PANEL
ALT: 30 U/L (ref 0–44)
AST: 35 U/L (ref 15–41)
Albumin: 3.4 g/dL — ABNORMAL LOW (ref 3.5–5.0)
Alkaline Phosphatase: 88 U/L (ref 38–126)
Anion gap: 8 (ref 5–15)
BUN: 11 mg/dL (ref 6–20)
CO2: 23 mmol/L (ref 22–32)
Calcium: 8.5 mg/dL — ABNORMAL LOW (ref 8.9–10.3)
Chloride: 110 mmol/L (ref 98–111)
Creatinine, Ser: 0.67 mg/dL (ref 0.44–1.00)
GFR calc Af Amer: >60 mL/min (ref >60)
GFR calc non Af Amer: >60 mL/min (ref >60)
Glucose, Bld: 149 mg/dL — ABNORMAL HIGH (ref 70–99)
Potassium: 3.6 mmol/L (ref 3.5–5.1)
Sodium: 141 mmol/L (ref 135–145)
Total Bilirubin: 0.4 mg/dL (ref 0.3–1.2)
Total Protein: 7.1 g/dL (ref 6.5–8.1)

## 2018-02-03 MED ORDER — CLINDAMYCIN HCL 150 MG PO CAPS
300.0000 mg | ORAL_CAPSULE | Freq: Once | ORAL | Status: AC
  Administered 2018-02-03: 300 mg via ORAL
  Filled 2018-02-03: qty 2

## 2018-02-03 MED ORDER — CLINDAMYCIN HCL 300 MG PO CAPS: 300.0000 mg | ORAL_CAPSULE | Freq: Three times a day (TID) | ORAL | 0 refills | Status: AC

## 2018-02-03 NOTE — Discharge Instructions (Addendum)
Please follow-up with your primary care doctor in the next 3 to 5 days for recheck/reevaluation.  Please take your antibiotic as prescribed for the next 10 days.  Return to the emergency department for any fever, or any other symptom personally concerning to yourself.

## 2018-02-03 NOTE — ED Triage Notes (Signed)
Pt states "I was transferred here from [facility] for a diabetic ulcer on my toe (L big toe x 1 week) and possible cellulitis to head. Pt states long medical hx. A&O, no distress noted. Talking in complete sentences.

## 2018-02-03 NOTE — ED Provider Notes (Signed)
[facility] Emergency Department Provider Note  Time seen: 12:54 PM  I have reviewed the triage vital signs and the nursing notes.   HISTORY  Chief Complaint Skin Ulcer    HPI Angela Hubbard is a 53 y.o. female the past medical history of anemia, hypertension, hyperlipidemia, neuropathy, diabetes, presents to the emergency department for evaluation of ulcerations and abscesses.  According to the patient for the past 1 week she has had a very small ulcer to the bottom of her left great toe.  States that ulcer appears to be healing.  However over the past 1 week she is also developed multiple areas of tender bumps to her scalp.  States several of which have been draining.  States they are itching and she has been scratching them.  Denies any known fever.  Patient was seen a [facility] clinic and referred to the ER for further evaluation.   Past Medical History:  Diagnosis Date  . Anemia   . Autoimmune disorder ([place])   . Diabetes mellitus without complication ([place])   . Family history of BRCA2 gene positive   . Family history of breast cancer   . Hyperlipidemia   . Hypertension   . Morbid obesity ([place])   . Neuropathy   . Peripheral edema    chronic  . Type 1 diabetes ([place])   . Ulcerative colitis [facility])     Patient Active Problem List   Diagnosis Date Noted  . Genetic testing 01/22/2017  . Family history of breast cancer   . Family history of BRCA2 gene positive   . Type 1 diabetes ([place])   . Autoimmune disorder ([place])   . Ulcerative colitis ([place])   . Callus of foot 02/25/2014  . Diabetes mellitus type 2 with neurological manifestations ([place]) 11/04/2013  . Hypertension 11/04/2013  . Hyperlipidemia associated with type 2 diabetes mellitus ([place]) 11/04/2013    Past Surgical History:  Procedure Laterality Date  . CHOLECYSTECTOMY      Prior to Admission medications   Medication Sig Start Date End Date Taking? Authorizing Provider  ACCU-CHEK AVIVA PLUS  test strip  02/07/14   [provider]  albuterol (PROVENTIL HFA;VENTOLIN HFA) 108 (90 BASE) MCG/ACT inhaler Inhale into the lungs every 6 (six) hours as needed for wheezing or shortness of breath.    [provider]  amitriptyline (ELAVIL) 50 MG tablet Take 50 mg by mouth at bedtime.    [provider]  aspirin 81 MG tablet Take 81 mg by mouth daily.    [provider]  atorvastatin (LIPITOR) 80 MG tablet Take 80 mg by mouth daily.    [provider]  cefUROXime (CEFTIN) 500 MG tablet  11/24/13   [provider]  cyclobenzaprine (FLEXERIL) 10 MG tablet  11/24/13   [provider]  fluticasone (FLONASE) 50 MCG/ACT nasal spray  11/24/13   [provider]  furosemide (LASIX) 40 MG tablet Take 40 mg by mouth.    [provider]  gemfibrozil (LOPID) 600 MG tablet Take 600 mg by mouth 2 (two) times daily before a meal.    [provider]  HYDROcodone-acetaminophen (NORCO) 7.5-325 MG per tablet Take 1 tablet by mouth every 6 (six) hours as needed for moderate pain.    [provider]  insulin NPH-regular Human (NOVOLIN 70/30) (70-30) 100 UNIT/ML injection Inject into the skin.    [provider]  levothyroxine (SYNTHROID, LEVOTHROID) 50 MCG tablet Take 50 mcg by mouth daily before breakfast.  [provider]  lisinopril (PRINIVIL,ZESTRIL) 20 MG tablet Take 20 mg by mouth daily.    [provider]  metFORMIN (GLUCOPHAGE) 1000 MG tablet Take 1,000 mg by mouth 2 (two) times daily with a meal.    [provider]  metoprolol succinate (TOPROL-XL) 25 MG 24 hr tablet Take 25 mg by mouth daily.    [provider]  metoprolol tartrate (LOPRESSOR) 25 MG tablet  12/23/13   [provider]  Multiple Vitamin (MULTI VITAMIN DAILY PO) Take by mouth.    [provider]  nystatin (MYCOSTATIN/NYSTOP) 100000 UNIT/GM POWD  02/07/14   [provider]   omeprazole (PRILOSEC) 40 MG capsule Take 40 mg by mouth daily.    [provider]  pregabalin (LYRICA) 150 MG capsule Take 150 mg by mouth 2 (two) times daily.    [provider]    No Known Allergies  Family History  Problem Relation Age of Onset  . Hypertension Father   . Diabetes Father   . Hyperlipidemia Father   . Peripheral vascular disease Father   . Liver cancer Father   . Alcohol abuse Mother   . Drug abuse Mother   . BRCA 1/2 Sister   . Breast cancer Sister 9       maternal half sister  . Throat cancer Maternal Uncle   . Parkinson's disease Maternal Grandmother   . Diabetes Paternal Grandfather     Social History Social History   Tobacco Use  . Smoking status: Current Every Day Smoker    Packs/day: 0.50    Years: 30.00    Pack years: 15.00    Types: Cigarettes  Substance Use Topics  . Alcohol use: No    Alcohol/week: 0.0 standard drinks  . Drug use: No    Review of Systems Constitutional: Negative for fever. Cardiovascular: Negative for chest pain. Respiratory: Negative for shortness of breath. Gastrointestinal: Negative for abdominal pain, vomiting  Musculoskeletal: Negative for musculoskeletal complaints Skin: Patient was double tender areas to her scalp consistent with abscess.  Ulceration left big toe. Neurological: Negative for headache All other ROS negative  ____________________________________________   PHYSICAL EXAM:  VITAL SIGNS: ED Triage Vitals  Enc Vitals Group     BP 02/03/18 1108 103/75     Pulse Rate 02/03/18 1108 (!) 105     Resp 02/03/18 1108 18     Temp 02/03/18 1108 98.3 F (36.8 C)     Temp Source 02/03/18 1108 Oral     SpO2 02/03/18 1108 98 %     Weight 02/03/18 1109 245 lb (111.1 kg)     Height 02/03/18 1109 _0  (1.727 m)     Head Circumference --      Peak Flow --      Pain Score 02/03/18 1108 7     Pain Loc --      Pain Edu? --      Excl. in [place]? --    Constitutional: Alert and oriented.  Well appearing and in no distress. Eyes: Normal exam ENT   Head: Patient has approximately 5-6 tender raised areas across her scalp, most consistent with folliculitis vs small abscesses.   Mouth/Throat: Mucous membranes are moist. Cardiovascular: Normal rate, regular rhythm. No murmur Respiratory: Normal respiratory effort without tachypnea nor retractions. Breath sounds are clear  Gastrointestinal: Soft and nontender. No distention.  Musculoskeletal: Nontender with normal range of motion in all extremities.  Neurologic:  Normal speech and language. No gross focal neurologic deficits  Skin: Small ulceration to the bottom of the left great toe appears to be healing, largely nontender, no surrounding erythema or edema.  Patient has several 5-6 areas consistent with abscess to her scalp. Psychiatric: Mood and affect are normal.   ____________________________________________   INITIAL IMPRESSION / ASSESSMENT AND PLAN / ED COURSE  Pertinent labs & imaging results that were available during my care of the patient were reviewed by me and considered in my medical decision making (see chart for details).  Patient presents emergency department from walk-in clinic for evaluation of ulceration to her left great toe and abscesses to her scalp.  Patient's labs are reassuring including a normal white blood cell count.  Afebrile.  Bedside ultrasound shows  Bedside ultrasound shows inflamed tissue, but no drainable abscess at this time.  We will discharge on clindamycin patient will follow-up with her primary care doctor at the end of this week.  Patient agreeable to plan of care.  ____________________________________________   FINAL CLINICAL IMPRESSION(S) / ED DIAGNOSES  Abscess Ulceration   Harvest Dark, MD 02/03/18 1333

## 2018-02-03 NOTE — ED Notes (Signed)
Pt ordered [facility] while waiting in lobby. First RN informed pt that she can't eat/drink at this time.

## 2018-02-09 ENCOUNTER — Encounter: Payer: Self-pay | Admitting: *Deleted

## 2018-02-09 ENCOUNTER — Emergency Department
Admission: EM | Admit: 2018-02-09 | Discharge: 2018-02-09 | Disposition: A | Discharge status: Home / Self Care | Payer: Medicaid Other | Attending: Emergency Medicine | Admitting: Emergency Medicine

## 2018-02-09 ENCOUNTER — Other Ambulatory Visit: Payer: Self-pay

## 2018-02-09 DIAGNOSIS — Z7982 Long term (current) use of aspirin: Secondary | ICD-10-CM | POA: Insufficient documentation

## 2018-02-09 DIAGNOSIS — E1169 Type 2 diabetes mellitus with other specified complication: Secondary | ICD-10-CM | POA: Insufficient documentation

## 2018-02-09 DIAGNOSIS — Z79899 Other long term (current) drug therapy: Secondary | ICD-10-CM | POA: Diagnosis not present

## 2018-02-09 DIAGNOSIS — F17210 Nicotine dependence, cigarettes, uncomplicated: Secondary | ICD-10-CM | POA: Diagnosis not present

## 2018-02-09 DIAGNOSIS — Z794 Long term (current) use of insulin: Secondary | ICD-10-CM | POA: Diagnosis not present

## 2018-02-09 DIAGNOSIS — E109 Type 1 diabetes mellitus without complications: Secondary | ICD-10-CM | POA: Diagnosis not present

## 2018-02-09 DIAGNOSIS — R070 Pain in throat: Secondary | ICD-10-CM | POA: Diagnosis present

## 2018-02-09 DIAGNOSIS — K209 Esophagitis, unspecified without bleeding: Secondary | ICD-10-CM

## 2018-02-09 DIAGNOSIS — E785 Hyperlipidemia, unspecified: Secondary | ICD-10-CM | POA: Insufficient documentation

## 2018-02-09 LAB — CBC WITH DIFFERENTIAL/PLATELET
Abs Immature Granulocytes: 0.08 10*3/uL — ABNORMAL HIGH (ref 0.00–0.07)
Basophils Absolute: 0.1 10*3/uL (ref 0.0–0.1)
Basophils Relative: 0 %
Eosinophils Absolute: 0.1 10*3/uL (ref 0.0–0.5)
Eosinophils Relative: 1 %
HCT: 43.1 % (ref 36.0–46.0)
Hemoglobin: 13.7 g/dL (ref 12.0–15.0)
Immature Granulocytes: 1 %
Lymphocytes Relative: 29 %
Lymphs Abs: 3.2 10*3/uL (ref 0.7–4.0)
MCH: 29.1 pg (ref 26.0–34.0)
MCHC: 31.8 g/dL (ref 30.0–36.0)
MCV: 91.5 fL (ref 80.0–100.0)
Monocytes Absolute: 0.5 10*3/uL (ref 0.1–1.0)
Monocytes Relative: 5 %
Neutro Abs: 7.3 10*3/uL (ref 1.7–7.7)
Neutrophils Relative %: 64 %
Platelets: 393 10*3/uL (ref 150–400)
RBC: 4.71 MIL/uL (ref 3.87–5.11)
RDW: 13.6 % (ref 11.5–15.5)
WBC: 11.3 10*3/uL — ABNORMAL HIGH (ref 4.0–10.5)
nRBC: 0 % (ref 0.0–0.2)

## 2018-02-09 LAB — COMPREHENSIVE METABOLIC PANEL
ALT: 112 U/L — ABNORMAL HIGH (ref 0–44)
AST: 72 U/L — ABNORMAL HIGH (ref 15–41)
Albumin: 3.7 g/dL (ref 3.5–5.0)
Alkaline Phosphatase: 96 U/L (ref 38–126)
Anion gap: 7 (ref 5–15)
BUN: 11 mg/dL (ref 6–20)
CO2: 30 mmol/L (ref 22–32)
Calcium: 8.8 mg/dL — ABNORMAL LOW (ref 8.9–10.3)
Chloride: 102 mmol/L (ref 98–111)
Creatinine, Ser: 0.66 mg/dL (ref 0.44–1.00)
GFR calc Af Amer: >60 mL/min (ref >60)
GFR calc non Af Amer: >60 mL/min (ref >60)
Glucose, Bld: 135 mg/dL — ABNORMAL HIGH (ref 70–99)
Potassium: 4.6 mmol/L (ref 3.5–5.1)
Sodium: 139 mmol/L (ref 135–145)
Total Bilirubin: 0.6 mg/dL (ref 0.3–1.2)
Total Protein: 8.2 g/dL — ABNORMAL HIGH (ref 6.5–8.1)

## 2018-02-09 LAB — GLUCOSE, CAPILLARY: Glucose-Capillary: 127 mg/dL — ABNORMAL HIGH (ref 70–99)

## 2018-02-09 MED ORDER — ALUM & MAG HYDROXIDE-SIMETH 200-200-20 MG/5ML PO SUSP
30.0000 mL | Freq: Once | ORAL | Status: AC
  Administered 2018-02-09: 30 mL via ORAL
  Filled 2018-02-09 (×2): qty 30

## 2018-02-09 MED ORDER — LIDOCAINE VISCOUS HCL 2 % MT SOLN
15.0000 mL | Freq: Once | OROMUCOSAL | Status: AC
  Administered 2018-02-09: 15 mL via ORAL
  Filled 2018-02-09: qty 15

## 2018-02-09 MED ORDER — LIDOCAINE VISCOUS HCL 2 % MT SOLN: 15.0000 mL | Freq: Four times a day (QID) | OROMUCOSAL | 0 refills | Status: DC | PRN

## 2018-02-09 NOTE — ED Triage Notes (Signed)
First Nurse Note:  C/O esophageal burning with swallowing.  Attributes discomfort to prescribed Clindamycin.  AAOx3.  Skin warm and dry. NAD

## 2018-02-09 NOTE — ED Triage Notes (Signed)
Pt to ED reporting multiple diabetic ulcers on both her feet. Pt placed on clindamycin last week for MRSA abscesses in her hair. One ulcer on her right toe was assessesed last week but pt reports it has worsened and will having drainage intermittently. No fevers at home.   Pt reporting one of the main reasons for visiting is the pain that has started in her throat since taking the antibiotics. Pt reporting pain when eating and increased GERD since starting the medications. NO swelling, SOB or increased WOB. No rashes or signs of allergic reaction.   Pt also reports diarrhea since starting antibiotic. approx 4 times a day. Pt has been taking Pepto Bismol and TUMs without relief.

## 2018-02-09 NOTE — ED Provider Notes (Signed)
[facility] Emergency Department Provider Note   ____________________________________________    I have reviewed the triage vital signs and the nursing notes.   HISTORY  Chief Complaint Throat pain    HPI Angela Hubbard is a 53 y.o. female who reports that she has been on clindamycin for several days for diabetic ulcer which has improved but over the last 24 hours has developed pain with swallowing or eating.  She denies vomiting.  No fevers or chills.  The pain is in the center of her chest but only when she swallows.  Has never had this before.  No abdominal pain  Past Medical History:  Diagnosis Date  . Anemia   . Autoimmune disorder ([place])   . Diabetes mellitus without complication ([place])   . Family history of BRCA2 gene positive   . Family history of breast cancer   . Hyperlipidemia   . Hypertension   . Morbid obesity ([place])   . Neuropathy   . Peripheral edema    chronic  . Type 1 diabetes ([place])   . Ulcerative colitis [facility])     Patient Active Problem List   Diagnosis Date Noted  . Genetic testing 01/22/2017  . Family history of breast cancer   . Family history of BRCA2 gene positive   . Type 1 diabetes ([place])   . Autoimmune disorder ([place])   . Ulcerative colitis ([place])   . Callus of foot 02/25/2014  . Diabetes mellitus type 2 with neurological manifestations ([place]) 11/04/2013  . Hypertension 11/04/2013  . Hyperlipidemia associated with type 2 diabetes mellitus ([place]) 11/04/2013    Past Surgical History:  Procedure Laterality Date  . CHOLECYSTECTOMY      Prior to Admission medications   Medication Sig Start Date End Date Taking? Authorizing Provider  ACCU-CHEK AVIVA PLUS test strip  02/07/14   [provider]  albuterol (PROVENTIL HFA;VENTOLIN HFA) 108 (90 BASE) MCG/ACT inhaler Inhale into the lungs every 6 (six) hours as needed for wheezing or shortness of breath.    [provider]  amitriptyline (ELAVIL) 50 MG  tablet Take 50 mg by mouth at bedtime.    [provider]  aspirin 81 MG tablet Take 81 mg by mouth daily.    [provider]  atorvastatin (LIPITOR) 80 MG tablet Take 80 mg by mouth daily.    [provider]  cefUROXime (CEFTIN) 500 MG tablet  11/24/13   [provider]  clindamycin (CLEOCIN) 300 MG capsule Take 1 capsule (300 mg total) by mouth 3 (three) times daily for 10 days. 02/03/18 02/13/18  Harvest Dark, MD  cyclobenzaprine (FLEXERIL) 10 MG tablet  11/24/13   [provider]  fluticasone Asencion Islam) 50 MCG/ACT nasal spray  11/24/13   [provider]  furosemide (LASIX) 40 MG tablet Take 40 mg by mouth.    [provider]  gemfibrozil (LOPID) 600 MG tablet Take 600 mg by mouth 2 (two) times daily before a meal.    [provider]  HYDROcodone-acetaminophen (NORCO) 7.5-325 MG per tablet Take 1 tablet by mouth every 6 (six) hours as needed for moderate pain.    [provider]  insulin NPH-regular Human (NOVOLIN 70/30) (70-30) 100 UNIT/ML injection Inject into the skin.    [provider]  levothyroxine (SYNTHROID, LEVOTHROID) 50 MCG tablet Take 50 mcg by mouth daily before breakfast.    [provider]  lidocaine (XYLOCAINE) 2 % solution Use as directed 15 mLs in the mouth or  throat every 6 (six) hours as needed for mouth pain. 02/09/18   Lavonia Drafts, MD  lisinopril (PRINIVIL,ZESTRIL) 20 MG tablet Take 20 mg by mouth daily.    [provider]  metFORMIN (GLUCOPHAGE) 1000 MG tablet Take 1,000 mg by mouth 2 (two) times daily with a meal.    [provider]  metoprolol succinate (TOPROL-XL) 25 MG 24 hr tablet Take 25 mg by mouth daily.    [provider]  metoprolol tartrate (LOPRESSOR) 25 MG tablet  12/23/13   [provider]  Multiple Vitamin (MULTI VITAMIN DAILY PO) Take by mouth.    [provider]  nystatin (MYCOSTATIN/NYSTOP) 100000 UNIT/GM POWD   02/07/14   [provider]  omeprazole (PRILOSEC) 40 MG capsule Take 40 mg by mouth daily.    [provider]  pregabalin (LYRICA) 150 MG capsule Take 150 mg by mouth 2 (two) times daily.    [provider]     Allergies Patient has no known allergies.  Family History  Problem Relation Age of Onset  . Hypertension Father   . Diabetes Father   . Hyperlipidemia Father   . Peripheral vascular disease Father   . Liver cancer Father   . Alcohol abuse Mother   . Drug abuse Mother   . BRCA 1/2 Sister   . Breast cancer Sister 28       maternal half sister  . Throat cancer Maternal Uncle   . Parkinson's disease Maternal Grandmother   . Diabetes Paternal Grandfather     Social History Social History   Tobacco Use  . Smoking status: Current Every Day Smoker    Packs/day: 0.50    Years: 30.00    Pack years: 15.00    Types: Cigarettes  . Smokeless tobacco: Never Used  Substance Use Topics  . Alcohol use: No    Alcohol/week: 0.0 standard drinks  . Drug use: No    Review of Systems  Constitutional: No fever/chills Eyes: No visual changes.  ENT: As above Cardiovascular: As above Respiratory: Denies shortness of breath. Gastrointestinal: As Genitourinary: Negative for dysuria. Musculoskeletal: Negative for back pain. Skin: Negative for rash. Neurological: Negative for headaches   ____________________________________________   PHYSICAL EXAM:  VITAL SIGNS: ED Triage Vitals  Enc Vitals Group     BP 02/09/18 1235 97/71     Pulse Rate 02/09/18 1235 (!) 103     Resp --      Temp 02/09/18 1235 97.8 F (36.6 C)     Temp Source 02/09/18 1235 Oral     SpO2 02/09/18 1235 96 %     Weight 02/09/18 1235 107.5 kg (237 lb)     Height 02/09/18 1235 1.727 m (5' 8" )     Head Circumference --      Peak Flow --      Pain Score 02/09/18 1254 0     Pain Loc --      Pain Edu? --      Excl. in [place]? --     Constitutional: Alert and oriented.   Nose: No  congestion/rhinnorhea. Mouth/Throat: Mucous membranes are moist.    Cardiovascular: Normal rate, regular rhythm. Grossly normal heart sounds.  Good peripheral circulation. Respiratory: Normal respiratory effort.  No retractions. Lungs CTAB. Gastrointestinal: Soft and nontender. No distention.  No CVA tenderness.  Musculoskeletal: Warm and well perfused Neurologic:  Normal speech and language. No gross focal neurologic deficits are appreciated.  Skin:  Skin is warm, dry and intact. No rash  noted. Psychiatric: Mood and affect are normal. Speech and behavior are normal.  ____________________________________________   LABS (all labs ordered are listed, but only abnormal results are displayed)  Labs Reviewed  COMPREHENSIVE METABOLIC PANEL - Abnormal; Notable for the following components:      Result Value   Glucose, Bld 135 (*)    Calcium 8.8 (*)    Total Protein 8.2 (*)    AST 72 (*)    ALT 112 (*)    All other components within normal limits  CBC WITH DIFFERENTIAL/PLATELET - Abnormal; Notable for the following components:   WBC 11.3 (*)    Abs Immature Granulocytes 0.08 (*)    All other components within normal limits  GLUCOSE, CAPILLARY - Abnormal; Notable for the following components:   Glucose-Capillary 127 (*)    All other components within normal limits  CBG MONITORING, ED   ____________________________________________  EKG  None ____________________________________________  RADIOLOGY  None ____________________________________________   PROCEDURES  Procedure(s) performed: No  Procedures   Critical Care performed: No ____________________________________________   INITIAL IMPRESSION / ASSESSMENT AND PLAN / ED COURSE  Pertinent labs & imaging results that were available during my care of the patient were reviewed by me and considered in my medical decision making (see chart for details).  Patient's description is consistent with esophagitis, will give  p.o. lidocaine and see if this helps her symptoms.  Recommend stopping clindamycin at this point  Patient had significant relief after viscous lidocaine, will write a prescription for this, again indurated stopping clindamycin at this time, original reason for starting clindamycin is improved, no indication to continue this or switch antibiotics    ____________________________________________   FINAL CLINICAL IMPRESSION(S) / ED DIAGNOSES  Final diagnoses:  Esophagitis        Note:  This document was prepared using Dragon voice recognition software and may include unintentional dictation errors.   Lavonia Drafts, MD 02/09/18 1537

## 2018-02-17 ENCOUNTER — Encounter: Payer: Medicaid Other | Attending: Physician Assistant | Admitting: Physician Assistant

## 2018-02-17 DIAGNOSIS — R1310 Dysphagia, unspecified: Secondary | ICD-10-CM | POA: Diagnosis not present

## 2018-02-17 DIAGNOSIS — Z9884 Bariatric surgery status: Secondary | ICD-10-CM | POA: Insufficient documentation

## 2018-02-17 DIAGNOSIS — M1990 Unspecified osteoarthritis, unspecified site: Secondary | ICD-10-CM | POA: Insufficient documentation

## 2018-02-17 DIAGNOSIS — Z8249 Family history of ischemic heart disease and other diseases of the circulatory system: Secondary | ICD-10-CM | POA: Insufficient documentation

## 2018-02-17 DIAGNOSIS — L97522 Non-pressure chronic ulcer of other part of left foot with fat layer exposed: Secondary | ICD-10-CM | POA: Insufficient documentation

## 2018-02-17 DIAGNOSIS — Z87891 Personal history of nicotine dependence: Secondary | ICD-10-CM | POA: Diagnosis not present

## 2018-02-17 DIAGNOSIS — I10 Essential (primary) hypertension: Secondary | ICD-10-CM | POA: Diagnosis not present

## 2018-02-17 DIAGNOSIS — E1140 Type 2 diabetes mellitus with diabetic neuropathy, unspecified: Secondary | ICD-10-CM | POA: Diagnosis not present

## 2018-02-17 DIAGNOSIS — E11621 Type 2 diabetes mellitus with foot ulcer: Secondary | ICD-10-CM | POA: Diagnosis not present

## 2018-02-17 DIAGNOSIS — Z809 Family history of malignant neoplasm, unspecified: Secondary | ICD-10-CM | POA: Diagnosis not present

## 2018-02-17 DIAGNOSIS — Z881 Allergy status to other antibiotic agents status: Secondary | ICD-10-CM | POA: Insufficient documentation

## 2018-02-18 NOTE — Progress Notes (Signed)
Angela Hubbard ([id number]) Visit Report for 02/17/2018 Abuse/Suicide Risk Screen Details Patient Name: Angela Hubbard, Angela Hubbard Date of Service: 02/17/2018 2:45 PM Medical Record Number: [id number] Patient Account Number: [id number] Date of Birth/Sex: 1965-04-30 (53 y.o. F) Treating RN: Curtis Sites Primary Care Meeyah Ovitt: Beverley Fiedler Other Clinician: Referring Zameria Vogl: Beverley Fiedler Treating Zuri Lascala/Extender: STONE III, HOYT Weeks in Treatment: 0 Abuse/Suicide Risk Screen Items Answer ABUSE/SUICIDE RISK SCREEN: Has anyone close to you tried to hurt or harm you recentlyo No Do you feel uncomfortable with anyone in your familyo No Has anyone forced you do things that you didnot want to doo No Do you have any thoughts of harming yourselfo No Patient displays signs or symptoms of abuse and/or neglect. No Electronic Signature(s) Signed: 02/17/2018 4:47:13 PM By: Curtis Sites Entered By: Curtis Sites on 02/17/2018 15:19:40 NORVELL, RAKE ([id number]) -------------------------------------------------------------------------------- Activities of Daily Living Details Patient Name: Angela Hubbard Date of Service: 02/17/2018 2:45 PM Medical Record Number: [id number] Patient Account Number: [id number] Date of Birth/Sex: 23-Nov-1965 (53 y.o. F) Treating RN: Curtis Sites Primary Care Ethelmae Ringel: Beverley Fiedler Other Clinician: Referring Tiziana Cislo: Beverley Fiedler Treating Brandon Wiechman/Extender: Linwood Dibbles, HOYT Weeks in Treatment: 0 Activities of Daily Living Items Answer Activities of Daily Living (Please select one for each item) Drive Automobile Not Able Take Medications Completely Able Use Telephone Completely Able Care for Appearance Completely Able Use Toilet Completely Able Bath / Shower Completely Able Dress Self Completely Able Feed Self Completely Able Walk Completely Able Get In / Out Bed Completely Able Housework Completely Able Prepare Meals Completely Able Handle  Money Completely Able Shop for Self Completely Able Electronic Signature(s) Signed: 02/17/2018 4:47:13 PM By: Curtis Sites Entered By: Curtis Sites on 02/17/2018 15:28:57 PERLINA, KOHMAN ([id number]) -------------------------------------------------------------------------------- Education Assessment Details Patient Name: Angela Hubbard Date of Service: 02/17/2018 2:45 PM Medical Record Number: [id number] Patient Account Number: [id number] Date of Birth/Sex: 14-Sep-1965 (52 y.o. F) Treating RN: Curtis Sites Primary Care Felice Hope: Beverley Fiedler Other Clinician: Referring Maythe Deramo: Beverley Fiedler Treating Tarri Guilfoil/Extender: Linwood Dibbles, HOYT Weeks in Treatment: 0 Primary Learner Assessed: Patient Learning Preferences/Education Level/Primary Language Learning Preference: Explanation, Demonstration Highest Education Level: College or Above Preferred Language: English Cognitive Barrier Assessment/Beliefs Language Barrier: No Translator Needed: No Memory Deficit: No Emotional Barrier: No Cultural/Religious Beliefs Affecting Medical Care: No Physical Barrier Assessment Impaired Vision: No Impaired Hearing: No Decreased Hand dexterity: No Knowledge/Comprehension Assessment Knowledge Level: Medium Comprehension Level: Medium Ability to understand written Medium instructions: Ability to understand verbal Medium instructions: Motivation Assessment Anxiety Level: Calm Cooperation: Cooperative Education Importance: Acknowledges Need Interest in Health Problems: Asks Questions Perception: Coherent Willingness to Engage in Self- Medium Management Activities: Readiness to Engage in Self- Medium Management Activities: Electronic Signature(s) Signed: 02/17/2018 4:47:13 PM By: Curtis Sites Entered By: Curtis Sites on 02/17/2018 15:23:43 TOMASINA, NAVIA ([id number]) -------------------------------------------------------------------------------- Fall Risk Assessment  Details Patient Name: Angela Hubbard Date of Service: 02/17/2018 2:45 PM Medical Record Number: [id number] Patient Account Number: [id number] Date of Birth/Sex: October 06, 1965 (53 y.o. F) Treating RN: Curtis Sites Primary Care Sumire Halbleib: Beverley Fiedler Other Clinician: Referring Truda Staub: Beverley Fiedler Treating Silus Lanzo/Extender: Linwood Dibbles, HOYT Weeks in Treatment: 0 Fall Risk Assessment Items Have you had 2 or more falls in the last 12 monthso 0 No Have you had any fall that resulted in injury in the last 12 monthso 0 No FALL RISK ASSESSMENT: History of falling - immediate or within 3 months 0 No Secondary diagnosis 0 No Ambulatory aid None/bed rest/wheelchair/nurse 0 No Crutches/cane/walker 0 No Furniture 0 No IV Access/Saline Lock 0 No Gait/Training  Normal/bed rest/immobile 0 Yes Weak 0 No Impaired 0 No Mental Status Oriented to own ability 0 Yes Electronic Signature(s) Signed: 02/17/2018 4:47:13 PM By: Curtis Sitesorthy, Joanna Entered By: Curtis Sitesorthy, Joanna on 02/17/2018 15:23:53 Rumbold, Tanita ([id number]) -------------------------------------------------------------------------------- Foot Assessment Details Patient Name: Angela PicaMOATS, Angela Date of Service: 02/17/2018 2:45 PM Medical Record Number: [id number] Patient Account Number: [id number] Date of Birth/Sex: 07/09/1965 (53 y.o. F) Treating RN: Curtis Sitesorthy, Joanna Primary Care Reta Norgren: Beverley FiedlerANKINS, VICTORIA Other Clinician: Referring Paizlie Klaus: Beverley FiedlerANKINS, VICTORIA Treating Erland Vivas/Extender: Linwood DibblesSTONE III, HOYT Weeks in Treatment: 0 Foot Assessment Items Site Locations + = Sensation present, - = Sensation absent, C = Callus, U = Ulcer R = Redness, W = Warmth, M = Maceration, PU = Pre-ulcerative lesion F = Fissure, S = Swelling, D = Dryness Assessment Right: Left: Other Deformity: No No Prior Foot Ulcer: No No Prior Amputation: No No Charcot Joint: No No Ambulatory Status: Ambulatory Without Help Gait: Steady Electronic  Signature(s) Signed: 02/17/2018 4:47:13 PM By: Curtis Sitesorthy, Joanna Entered By: Curtis Sitesorthy, Joanna on 02/17/2018 15:31:53 Wey, Keyon ([id number]) -------------------------------------------------------------------------------- Nutrition Risk Assessment Details Patient Name: Angela PicaMOATS, Adalynd Date of Service: 02/17/2018 2:45 PM Medical Record Number: [id number] Patient Account Number: [id number] Date of Birth/Sex: 07/09/1965 (53 y.o. F) Treating RN: Curtis Sitesorthy, Joanna Primary Care Zeth Buday: Beverley FiedlerANKINS, VICTORIA Other Clinician: Referring Reegan Bouffard: Beverley FiedlerANKINS, VICTORIA Treating Shadow Schedler/Extender: Linwood DibblesSTONE III, HOYT Weeks in Treatment: 0 Height (in): 68 Weight (lbs): 240 Body Mass Index (BMI): 36.5 Nutrition Risk Assessment Items NUTRITION RISK SCREEN: I have an illness or condition that made me change the kind and/or amount of 0 No food I eat I eat fewer than two meals per day 0 No I eat few fruits and vegetables, or milk products 0 No I have three or more drinks of beer, liquor or wine almost every day 0 No I have tooth or mouth problems that make it hard for me to eat 0 No I don't always have enough money to buy the food I need 0 No I eat alone most of the time 0 No I take three or more different prescribed or over-the-counter drugs a day 1 Yes Without wanting to, I have lost or gained 10 pounds in the last six months 0 No I am not always physically able to shop, cook and/or feed myself 0 No Nutrition Protocols Good Risk Protocol 0 No interventions needed Moderate Risk Protocol Electronic Signature(s) Signed: 02/17/2018 4:47:13 PM By: Curtis Sitesorthy, Joanna Entered By: Curtis Sitesorthy, Joanna on 02/17/2018 15:23:59

## 2018-02-18 NOTE — Progress Notes (Signed)
JAYRA, CHOYCE ([id number]) Visit Report for 02/17/2018 Allergy List Details Patient Name: Angela Hubbard, Angela Hubbard Date of Service: 02/17/2018 2:45 PM Medical Record Number: [id number] Patient Account Number: [id number] Date of Birth/Sex: 17-Jun-1965 (53 y.o. F) Treating RN: Curtis Sites Primary Care Zacharee Gaddie: Beverley Fiedler Other Clinician: Referring Ruchi Stoney: Beverley Fiedler Treating Patric Vanpelt/Extender: STONE III, HOYT Weeks in Treatment: 0 Allergies Active Allergies clindamycin Reaction: couldn't swallow Allergy Notes Electronic Signature(s) Signed: 02/17/2018 4:47:13 PM By: Curtis Sites Entered By: Curtis Sites on 02/17/2018 15:17:06 Angela Hubbard ([id number]) -------------------------------------------------------------------------------- Arrival Information Details Patient Name: Angela Hubbard Date of Service: 02/17/2018 2:45 PM Medical Record Number: [id number] Patient Account Number: [id number] Date of Birth/Sex: 1965/07/28 (53 y.o. F) Treating RN: Curtis Sites Primary Care Aparna Vanderweele: Beverley Fiedler Other Clinician: Referring Lucianne Smestad: Beverley Fiedler Treating Bee Marchiano/Extender: Linwood Dibbles, HOYT Weeks in Treatment: 0 Visit Information Patient Arrived: Ambulatory Arrival Time: 15:13 Accompanied By: self Transfer Assistance: None Patient Identification Verified: Yes Secondary Verification Process Completed: Yes Patient Has Alerts: Yes Patient Alerts: DMI Electronic Signature(s) Signed: 02/17/2018 4:47:13 PM By: Curtis Sites Entered By: Curtis Sites on 02/17/2018 15:20:12 Angela Hubbard ([id number]) -------------------------------------------------------------------------------- Clinic Level of Care Assessment Details Patient Name: Angela Hubbard Date of Service: 02/17/2018 2:45 PM Medical Record Number: [id number] Patient Account Number: [id number] Date of Birth/Sex: May 14, 1965 (53 y.o. F) Treating RN: Arnette Norris Primary Care Eliel Dudding: Beverley Fiedler Other Clinician: Referring Pat Sires: Beverley Fiedler Treating Verble Styron/Extender: Linwood Dibbles, HOYT Weeks in Treatment: 0 Clinic Level of Care Assessment Items TOOL 1 Quantity Score []  - Use when EandM and Procedure is performed on INITIAL visit 0 ASSESSMENTS - Nursing Assessment / Reassessment X - General Physical Exam (combine w/ comprehensive assessment (listed just below) when 1 20 performed on new pt. evals) X- 1 25 Comprehensive Assessment (HX, ROS, Risk Assessments, Wounds Hx, etc.) ASSESSMENTS - Wound and Skin Assessment / Reassessment []  - Dermatologic / Skin Assessment (not related to wound area) 0 ASSESSMENTS - Ostomy and/or Continence Assessment and Care []  - Incontinence Assessment and Management 0 []  - 0 Ostomy Care Assessment and Management (repouching, etc.) PROCESS - Coordination of Care X - Simple Patient / Family Education for ongoing care 1 15 []  - 0 Complex (extensive) Patient / Family Education for ongoing care X- 1 10 Staff obtains Chiropractor, Records, Test Results / Process Orders []  - 0 Staff telephones HHA, Nursing Homes / Clarify orders / etc []  - 0 Routine Transfer to another Facility (non-emergent condition) []  - 0 Routine Hospital Admission (non-emergent condition) []  - 0 New Admissions / Manufacturing engineer / Ordering NPWT, Apligraf, etc. []  - 0 Emergency Hospital Admission (emergent condition) PROCESS - Special Needs []  - Pediatric / Minor Patient Management 0 []  - 0 Isolation Patient Management []  - 0 Hearing / Language / Visual special needs []  - 0 Assessment of Community assistance (transportation, D/C planning, etc.) []  - 0 Additional assistance / Altered mentation []  - 0 Support Surface(s) Assessment (bed, cushion, seat, etc.) Angela Hubbard, Angela Hubbard ([id number]) INTERVENTIONS - Miscellaneous []  - External ear exam 0 []  - 0 Patient Transfer (multiple staff / Nurse, adult / Similar devices) []  - 0 Simple Staple / Suture removal  (25 or less) []  - 0 Complex Staple / Suture removal (26 or more) []  - 0 Hypo/Hyperglycemic Management (do not check if billed separately) X- 1 15 Ankle / Brachial Index (ABI) - do not check if billed separately Has the patient been seen at the hospital within the last three years: Yes Total Score: 85 Level Of Care: New/Established - Level 3  Electronic Signature(s) Signed: 02/17/2018 5:16:49 PM By: Arnette Norris Entered By: Arnette Norris on 02/17/2018 16:28:33 Angela Hubbard, Angela Hubbard ([id number]) -------------------------------------------------------------------------------- Encounter Discharge Information Details Patient Name: Angela Hubbard Date of Service: 02/17/2018 2:45 PM Medical Record Number: [id number] Patient Account Number: [id number] Date of Birth/Sex: February 08, 1965 (53 y.o. F) Treating RN: Arnette Norris Primary Care Aletta Edmunds: Beverley Fiedler Other Clinician: Referring Goldia Ligman: Beverley Fiedler Treating Kalei Meda/Extender: Linwood Dibbles, HOYT Weeks in Treatment: 0 Encounter Discharge Information Items Post Procedure Vitals Discharge Condition: Stable Temperature (F): 98.2 Ambulatory Status: Ambulatory Pulse (bpm): 93 Discharge Destination: Home Respiratory Rate (breaths/min): 18 Transportation: Private Auto Blood Pressure (mmHg): 116/69 Accompanied By: self Schedule Follow-up Appointment: Yes Clinical Summary of Care: Electronic Signature(s) Signed: 02/17/2018 5:16:49 PM By: Arnette Norris Entered By: Arnette Norris on 02/17/2018 16:36:07 Angela Hubbard ([id number]) -------------------------------------------------------------------------------- Lower Extremity Assessment Details Patient Name: Angela Hubbard Date of Service: 02/17/2018 2:45 PM Medical Record Number: [id number] Patient Account Number: [id number] Date of Birth/Sex: [birth year] (53 y.o. F) Treating RN: Curtis Sites Primary Care Macoy Rodwell: Beverley Fiedler Other Clinician: Referring Lamees Gable:  Beverley Fiedler Treating Briannia Laba/Extender: Linwood Dibbles, HOYT Weeks in Treatment: 0 Edema Assessment Assessed: [Left: No] [Right: No] Edema: [Left: No] [Right: No] Vascular Assessment Pulses: Dorsalis Pedis Palpable: [Left:Yes] [Right:Yes] Posterior Tibial Palpable: [Left:Yes] [Right:Yes] Extremity colors, hair growth, and conditions: Extremity Color: [Left:Normal] [Right:Normal] Hair Growth on Extremity: [Left:No] [Right:No] Temperature of Extremity: [Left:Cool] [Right:Cool] Capillary Refill: [Left:< 3 seconds] [Right:< 3 seconds] Blood Pressure: Brachial: [Left:108] Dorsalis Pedis: 124 [Left:Dorsalis Pedis: 128] Ankle: Posterior Tibial: 112 [Left:Posterior Tibial: 126 1.15] [Right:1.19] Toe Nail Assessment Left: Right: Thick: Yes Yes Discolored: Yes Yes Deformed: No No Improper Length and Hygiene: No No Electronic Signature(s) Signed: 02/17/2018 4:47:13 PM By: Curtis Sites Entered By: Curtis Sites on 02/17/2018 15:51:52 Angela Hubbard, Angela Hubbard ([id number]) -------------------------------------------------------------------------------- Multi Wound Chart Details Patient Name: Angela Hubbard Date of Service: 02/17/2018 2:45 PM Medical Record Number: [id number] Patient Account Number: [id number] Date of Birth/Sex: 1965/11/24 (53 y.o. F) Treating RN: Arnette Norris Primary Care Cristina Ceniceros: Beverley Fiedler Other Clinician: Referring Daylyn Christine: Beverley Fiedler Treating Trudie Cervantes/Extender: Linwood Dibbles, HOYT Weeks in Treatment: 0 Vital Signs Height(in): 68 Pulse(bpm): 93 Weight(lbs): 240 Blood Pressure(mmHg): 116/69 Body Mass Index(BMI): 36 Temperature(F): 98.2 Respiratory Rate 18 (breaths/min): Photos: [N/A:N/A] Wound Location: Left Toe Great N/A N/A Wounding Event: Gradually Appeared N/A N/A Primary Etiology: Diabetic Wound/Ulcer of the N/A N/A Lower Extremity Comorbid History: Cataracts, Hypertension, Type N/A N/A I Diabetes, Osteoarthritis, Neuropathy Date  Acquired: 01/20/2018 N/A N/A Weeks of Treatment: 0 N/A N/A Wound Status: Open N/A N/A Pending Amputation on Yes N/A N/A Presentation: Measurements L x W x D 0.1x0.1x0.1 N/A N/A (cm) Area (cm) : 0.008 N/A N/A Volume (cm) : 0.001 N/A N/A Classification: Grade 1 N/A N/A Exudate Amount: None Present N/A N/A Wound Margin: Flat and Intact N/A N/A Granulation Amount: None Present (0%) N/A N/A Necrotic Amount: None Present (0%) N/A N/A Exposed Structures: Fascia: No N/A N/A Fat Layer (Subcutaneous Tissue) Exposed: No Tendon: No Muscle: No Joint: No Bone: No Limited to Skin Breakdown Gordon, Rhylei ([id number]) Epithelialization: None N/A N/A Periwound Skin Texture: Callus: Yes N/A N/A Excoriation: No Induration: No Crepitus: No Rash: No Scarring: No Periwound Skin Moisture: Maceration: No N/A N/A Dry/Scaly: No Periwound Skin Color: Atrophie Blanche: No N/A N/A Cyanosis: No Ecchymosis: No Erythema: No Hemosiderin Staining: No Mottled: No Pallor: No Rubor: No Temperature: No Abnormality N/A N/A Tenderness on Palpation: No N/A N/A Wound Preparation: Ulcer Cleansing: N/A N/A Rinsed/Irrigated with Saline Topical Anesthetic Applied: Other: lidocaine 4% Treatment Notes  Electronic Signature(s) Signed: 02/17/2018 5:16:49 PM By: Arnette NorrisBiell, Kristina Entered By: Arnette NorrisBiell, Kristina on 02/17/2018 16:21:39 Angela PicaMOATS, Angela Hubbard ([id number]) -------------------------------------------------------------------------------- Multi-Disciplinary Care Plan Details Patient Name: Angela PicaMOATS, Angela Hubbard Date of Service: 02/17/2018 2:45 PM Medical Record Number: [id number] Patient Account Number: [id number] Date of Birth/Sex: [birth year] (53 y.o. F) Treating RN: Arnette NorrisBiell, Kristina Primary Care Janna Oak: Beverley FiedlerANKINS, VICTORIA Other Clinician: Referring Zamorah Ailes: Beverley FiedlerANKINS, VICTORIA Treating Shayden Bobier/Extender: Linwood DibblesSTONE III, HOYT Weeks in Treatment: 0 Active Inactive Electronic Signature(s) Signed: 02/17/2018 5:16:49  PM By: Arnette NorrisBiell, Kristina Entered By: Arnette NorrisBiell, Kristina on 02/17/2018 17:03:48 Angela Hubbard, Angela Hubbard ([id number]) -------------------------------------------------------------------------------- Non-Wound Condition Assessment Details Patient Name: Angela PicaMOATS, Angela Hubbard Date of Service: 02/17/2018 2:45 PM Medical Record Number: [id number] Patient Account Number: [id number] Date of Birth/Sex: [birth year] (52 y.o. F) Treating RN: Curtis Sitesorthy, Joanna Primary Care Malaia Buchta: Beverley FiedlerANKINS, VICTORIA Other Clinician: Referring Denyse Fillion: Beverley FiedlerANKINS, VICTORIA Treating Deidre Carino/Extender: STONE III, HOYT Weeks in Treatment: 0 Non-Wound Condition: Condition: Other Dermatologic Condition Location: Head Side: Photos Periwound Skin Texture Texture Color No Abnormalities Noted: No No Abnormalities Noted: No Moisture No Abnormalities Noted: No Notes patient with multiple scabbed areas on her scalp. the areas are slightly raised, non tender, no hair growth and slightly red in color. She states that she has abscesses on her head and was treated with antibiotics Electronic Signature(s) Signed: 02/17/2018 4:14:12 PM By: Curtis Sitesorthy, Joanna Previous Signature: 02/17/2018 4:13:05 PM Version By: Curtis Sitesorthy, Joanna Previous Signature: 02/17/2018 4:12:29 PM Version By: Curtis Sitesorthy, Joanna Entered By: Curtis Sitesorthy, Joanna on 02/17/2018 16:14:12 Angela Hubbard, Angela Hubbard ([id number]) -------------------------------------------------------------------------------- Non-Wound Condition Assessment Details Patient Name: Angela PicaMOATS, Angela Hubbard Date of Service: 02/17/2018 2:45 PM Medical Record Number: [id number] Patient Account Number: [id number] Date of Birth/Sex: [birth year] (53 y.o. F) Treating RN: Curtis Sitesorthy, Joanna Primary Care Angela Hubbard Dewan: Beverley FiedlerANKINS, VICTORIA Other Clinician: Referring Rosevelt Luu: Beverley FiedlerANKINS, VICTORIA Treating Joesiah Lonon/Extender: STONE III, HOYT Weeks in Treatment: 0 Non-Wound Condition: Condition: Other Dermatologic Condition Location: Foot Side: Right Photos Periwound  Skin Texture Texture Color No Abnormalities Noted: No No Abnormalities Noted: No Callus: Yes Moisture No Abnormalities Noted: No Notes patient with discolored callus on her right great toe Electronic Signature(s) Signed: 02/17/2018 4:17:13 PM By: Curtis Sitesorthy, Joanna Entered By: Curtis Sitesorthy, Joanna on 02/17/2018 16:17:13 Angela Hubbard, Angela Hubbard ([id number]) -------------------------------------------------------------------------------- Pain Assessment Details Patient Name: Angela PicaMOATS, Angela Hubbard Date of Service: 02/17/2018 2:45 PM Medical Record Number: [id number] Patient Account Number: [id number] Date of Birth/Sex: [birth year] (53 y.o. F) Treating RN: Curtis Sitesorthy, Joanna Primary Care Griffyn Kucinski: Beverley FiedlerANKINS, VICTORIA Other Clinician: Referring Rebbie Lauricella: Beverley FiedlerANKINS, VICTORIA Treating Doniesha Landau/Extender: Linwood DibblesSTONE III, HOYT Weeks in Treatment: 0 Active Problems Location of Pain Severity and Description of Pain Patient Has Paino Yes Site Locations Pain Location: Pain in Ulcers With Dressing Change: Yes Duration of the Pain. Constant / Intermittento Intermittent Pain Management and Medication Current Pain Management: Electronic Signature(s) Signed: 02/17/2018 4:47:13 PM By: Curtis Sitesorthy, Joanna Entered By: Curtis Sitesorthy, Joanna on 02/17/2018 15:18:56 Angela PicaMOATS, Dena ([id number]) -------------------------------------------------------------------------------- Patient/Caregiver Education Details Patient Name: Angela PicaMOATS, Faizah Date of Service: 02/17/2018 2:45 PM Medical Record Number: [id number] Patient Account Number: [id number] Date of Birth/Gender: [birth year] (53 y.o. F) Treating RN: Arnette NorrisBiell, Kristina Primary Care Physician: Beverley FiedlerANKINS, VICTORIA Other Clinician: Referring Physician: Beverley FiedlerANKINS, VICTORIA Treating Physician/Extender: Linwood DibblesSTONE III, HOYT Weeks in Treatment: 0 Education Assessment Education Provided To: Patient Education Topics Provided [facility]: Handouts: [facility] Methods:  Demonstration, Explain/Verbal Responses: State content correctly Electronic Signature(s) Signed: 02/17/2018 5:16:49 PM By: Arnette NorrisBiell, Kristina Entered By: Arnette NorrisBiell, Kristina on 02/17/2018 16:36:11 Milhouse, Rileigh ([id number]) -------------------------------------------------------------------------------- Wound Assessment Details Patient Name: Angela PicaMOATS, Ellenore Date of Service: 02/17/2018 2:45 PM Medical Record Number: [id number] Patient  Account Number: [id number] Date of Birth/Sex: 1965/03/28 (52 y.o. F) Treating RN: Arnette Norris Primary Care Joselynne Killam: Beverley Fiedler Other Clinician: Referring Baili Stang: Beverley Fiedler Treating Conna Terada/Extender: STONE III, HOYT Weeks in Treatment: 0 Wound Status Wound Number: 1 Primary Diabetic Wound/Ulcer of the Lower Extremity Etiology: Wound Location: Left Toe Great Wound Healed - Epithelialized Wounding Event: Gradually Appeared Status: Date Acquired: 01/20/2018 Comorbid Cataracts, Hypertension, Type I Diabetes, Weeks Of Treatment: 0 History: Osteoarthritis, Neuropathy Clustered Wound: No Pending Amputation On Presentation Photos Photo Uploaded By: Curtis Sites on 02/17/2018 16:07:41 Wound Measurements Length: (cm) 0 Width: (cm) 0 Depth: (cm) 0 Area: (cm) 0 Volume: (cm) 0 % Reduction in Area: 100% % Reduction in Volume: 100% Epithelialization: None Tunneling: No Undermining: No Wound Description Classification: Grade 1 Foul O Wound Margin: Flat and Intact Slough Exudate Amount: None Present dor After Cleansing: No /Fibrino No Wound Bed Granulation Amount: None Present (0%) Exposed Structure Necrotic Amount: None Present (0%) Fascia Exposed: No Fat Layer (Subcutaneous Tissue) Exposed: No Tendon Exposed: No Muscle Exposed: No Joint Exposed: No Bone Exposed: No Limited to Skin Breakdown Periwound Skin Texture Hargreaves, Emmakate ([id number]) Texture Color No Abnormalities Noted: No No Abnormalities Noted: No Callus:  Yes Atrophie Blanche: No Crepitus: No Cyanosis: No Excoriation: No Ecchymosis: No Induration: No Erythema: No Rash: No Hemosiderin Staining: No Scarring: No Mottled: No Pallor: No Moisture Rubor: No No Abnormalities Noted: No Dry / Scaly: No Temperature / Pain Maceration: No Temperature: No Abnormality Wound Preparation Ulcer Cleansing: Rinsed/Irrigated with Saline Topical Anesthetic Applied: Other: lidocaine 4%, Electronic Signature(s) Signed: 02/17/2018 5:16:49 PM By: Arnette Norris Entered By: Arnette Norris on 02/17/2018 16:29:23 CHARNEL, WOLANIN ([id number]) -------------------------------------------------------------------------------- Vitals Details Patient Name: Angela Hubbard Date of Service: 02/17/2018 2:45 PM Medical Record Number: [id number] Patient Account Number: [id number] Date of Birth/Sex: 06-23-1965 (53 y.o. F) Treating RN: Curtis Sites Primary Care Kemisha Bonnette: Beverley Fiedler Other Clinician: Referring Sheridan Hew: Beverley Fiedler Treating Rayhan Groleau/Extender: STONE III, HOYT Weeks in Treatment: 0 Vital Signs Time Taken: 15:19 Temperature (F): 98.2 Height (in): 68 Pulse (bpm): 93 Source: Measured Respiratory Rate (breaths/min): 18 Weight (lbs): 240 Blood Pressure (mmHg): 116/69 Source: Measured Reference Range: 80 - 120 mg / dl Body Mass Index (BMI): 36.5 Airway Electronic Signature(s) Signed: 02/17/2018 4:47:13 PM By: Curtis Sites Entered By: Curtis Sites on 02/17/2018 15:19:29

## 2018-02-18 NOTE — Progress Notes (Signed)
WILLODENE, STALLINGS ([id number]) Visit Report for 02/17/2018 Chief Complaint Document Details Patient Name: Angela Hubbard, Angela Hubbard Date of Service: 02/17/2018 2:45 PM Medical Record Number: [id number] Patient Account Number: [id number] Date of Birth/Sex: [birth year] (53 y.o. F) Treating RN: Curtis Sites Primary Care Provider: Beverley Fiedler Other Clinician: Referring Provider: Beverley Fiedler Treating Provider/Extender: Linwood Dibbles, Serria Sloma Weeks in Treatment: 0 Information Obtained from: Patient Chief Complaint Left great toe ulcer Electronic Signature(s) Signed: 02/17/2018 4:58:34 PM By: Lenda Kelp PA-C Entered By: Lenda Kelp on 02/17/2018 16:01:45 Angela Hubbard, Angela Hubbard ([id number]) -------------------------------------------------------------------------------- HPI Details Patient Name: Angela Hubbard Date of Service: 02/17/2018 2:45 PM Medical Record Number: [id number] Patient Account Number: [id number] Date of Birth/Sex: 05/13/1965 (53 y.o. F) Treating RN: Curtis Sites Primary Care Provider: Beverley Fiedler Other Clinician: Referring Provider: Beverley Fiedler Treating Provider/Extender: Linwood Dibbles, Hikeem Andersson Weeks in Treatment: 0 History of Present Illness HPI Description: 02/17/18 on evaluation today patient presents for initial evaluation our clinic concerning issues that she has been having with her left home. She knows there has been some drainage even as close to today as Friday although that's been about four days back at this point. She is concerned as in the past she's had sleeping ulcers on her toes which have caused issues. Fortunately there is no sign of infection she has been tolerating the dressing's that she's been utilizing at home without any complication mainly this has been me Pearson. Along with this she has had what appears to be an infection in her scalp region which was initially treated at the emergency department with clindamycin. Unfortunately it sounds as if  this actually caused some issues which included her having difficulty swallowing. Fortunately that has resolved at this point. Nonetheless she does have a history of diabetes mellitus type II, hypertension, and the status post bariatric surgery. Currently there is no evidence of significant systemic infection which is good news. No fevers, chills, nausea, or vomiting noted at this time. Electronic Signature(s) Signed: 02/17/2018 4:58:34 PM By: Lenda Kelp PA-C Entered By: Lenda Kelp on 02/17/2018 16:51:57 Angela Hubbard, Angela Hubbard ([id number]) -------------------------------------------------------------------------------- Callus Pairing Details Patient Name: Angela Hubbard Date of Service: 02/17/2018 2:45 PM Medical Record Number: [id number] Patient Account Number: [id number] Date of Birth/Sex: 04/06/1965 (53 y.o. F) Treating RN: Arnette Norris Primary Care Provider: Beverley Fiedler Other Clinician: Referring Provider: Beverley Fiedler Treating Provider/Extender: Linwood Dibbles, Elverda Wendel Weeks in Treatment: 0 Procedure Performed for: NonWound Condition Other Dermatologic Condition - Right Foot Performed By: Physician Trellis Paganini., PA-C Post Procedure Diagnosis Same as Pre-procedure Electronic Signature(s) Signed: 02/17/2018 5:16:49 PM By: Arnette Norris Entered By: Arnette Norris on 02/17/2018 16:25:59 Angela Hubbard, Angela Hubbard ([id number]) -------------------------------------------------------------------------------- Physical Exam Details Patient Name: Angela Hubbard Date of Service: 02/17/2018 2:45 PM Medical Record Number: [id number] Patient Account Number: [id number] Date of Birth/Sex: Mar 27, 1965 (53 y.o. F) Treating RN: Curtis Sites Primary Care Provider: Beverley Fiedler Other Clinician: Referring Provider: Beverley Fiedler Treating Provider/Extender: STONE III, Sibley Rolison Weeks in Treatment: 0 Constitutional sitting or standing blood pressure is within target range for patient..  pulse regular and within target range for patient.Marland Kitchen respirations regular, non-labored and within target range for patient.Marland Kitchen temperature within target range for patient.. Well- nourished and well-hydrated in no acute distress. Eyes conjunctiva clear no eyelid edema noted. pupils equal round and reactive to light and accommodation. Ears, Nose, Mouth, and Throat no gross abnormality of ear auricles or external auditory canals. normal hearing noted during conversation. mucus membranes moist. Respiratory normal breathing without difficulty. clear to auscultation bilaterally. Cardiovascular regular rate  and rhythm with normal S1, S2. 2+ dorsalis pedis/posterior tibialis pulses. no clubbing, cyanosis, significant edema, <3 sec cap refill. Gastrointestinal (GI) soft, non-tender, non-distended, +BS. no ventral hernia noted. Musculoskeletal normal gait and posture. no significant deformity or arthritic changes, no loss or range of motion, no clubbing. Psychiatric this patient is able to make decisions and demonstrates good insight into disease process. Alert and Oriented x 3. pleasant and cooperative. Notes On inspection today patient did have some callous noted on both of the great toe plantar aspects which I did clean away in order to evaluate whether or not there was an open wound. Fortunately there did not appear to be the open wound at her location which was excellent news. There did not appear to be any drainage in all this is good news. Subsequently I did further evaluate her scalp region where it appears she may have a folliculitis which is for the most part resolving although she was never placed on any other antibiotics after being stopped as far as the clindamycin was concerned. Her primary care provider would like for her to be on something else and ask her to discuss this with me today and not to come see her tomorrow since essentially there's no need for her to be seen twice in just  the same number of days for the same issue. Currently there is some alopecia noted around the areas of infection. Electronic Signature(s) Signed: 02/17/2018 4:58:34 PM By: Lenda KelpStone III, Marcelo Ickes PA-C Entered By: Lenda KelpStone III, Lacee Grey on 02/17/2018 16:54:35 Angela Hubbard, Angela Hubbard ([id number]) -------------------------------------------------------------------------------- Physician Orders Details Patient Name: Angela PicaMOATS, Sher Date of Service: 02/17/2018 2:45 PM Medical Record Number: [id number] Patient Account Number: [id number] Date of Birth/Sex: 18-Jan-1966 (53 y.o. F) Treating RN: Arnette NorrisBiell, Kristina Primary Care Provider: Beverley FiedlerANKINS, VICTORIA Other Clinician: Referring Provider: Beverley FiedlerANKINS, VICTORIA Treating Provider/Extender: Linwood DibblesSTONE III, Arieon Corcoran Weeks in Treatment: 0 Verbal / Phone Orders: No Diagnosis Coding ICD-10 Coding Code Description E11.621 Type 2 diabetes mellitus with foot ulcer L97.522 Non-pressure chronic ulcer of other part of left foot with fat layer exposed E11.40 Type 2 diabetes mellitus with diabetic neuropathy, unspecified I10 Essential (primary) hypertension Z98.84 Bariatric surgery status [facility] - Use foam on callus for first week, then use corn callus pads, wear diabetic shoes Patient Medications Allergies: clindamycin Notifications Medication Indication Start End Bactrim DS 02/17/2018 DOSE 1 - oral 800 mg-160 mg tablet - 1 tablet oral taken 2 times a day for 14 days Electronic Signature(s) Signed: 02/17/2018 4:38:48 PM By: Lenda KelpStone III, Kariana Wiles PA-C Entered By: Lenda KelpStone III, Othon Guardia on 02/17/2018 16:38:48 Alkema, Christie ([id number]) -------------------------------------------------------------------------------- Problem List Details Patient Name: Angela PicaMOATS, Shunte Date of Service: 02/17/2018 2:45 PM Medical Record Number: [id number] Patient Account Number: [id number] Date of Birth/Sex: 18-Jan-1966 (53 y.o. 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 02/17/2018 No Yes E11.40 Type 2 diabetes mellitus with diabetic neuropathy, 02/17/2018 No Yes unspecified I10 Essential (primary) hypertension 02/17/2018 No Yes Z98.84 Bariatric surgery status 02/17/2018 No Yes Inactive Problems Resolved Problems Electronic Signature(s) Signed: 02/17/2018 4:58:34 PM By: Lenda KelpStone III, Shakya Sebring PA-C Entered By: Lenda KelpStone III, Jamila Slatten on 02/17/2018 16:51:08 Angela Hubbard, Angela Hubbard (  [id number]) -------------------------------------------------------------------------------- Progress Note Details Patient Name: MAGDELINE, PRANGE Date of Service: 02/17/2018 2:45 PM Medical Record Number: [id number] Patient Account Number: [id number] Date of Birth/Sex: Jun 16, 1965 (53 y.o. F) Treating RN: Curtis Sites Primary Care Provider: Beverley Fiedler Other Clinician: Referring Provider: Beverley Fiedler Treating Provider/Extender: Linwood Dibbles, Kolbi Tofte Weeks in Treatment: 0 Subjective Chief Complaint Information obtained from Patient Left great toe ulcer History of Present Illness (HPI) 02/17/18 on evaluation today patient presents for initial evaluation our clinic concerning issues that she has been having with her left home. She knows there has been some drainage even as close to today as Friday although that's been about four days back at this point. She is concerned as in the past she's had sleeping ulcers on her toes which have caused issues. Fortunately there is no sign of infection she has been tolerating the dressing's that she's been  utilizing at home without any complication mainly this has been me Pearson. Along with this she has had what appears to be an infection in her scalp region which was initially treated at the emergency department with clindamycin. Unfortunately it sounds as if this actually caused some issues which included her having difficulty swallowing. Fortunately that has resolved at this point. Nonetheless she does have a history of diabetes mellitus type II, hypertension, and the status post bariatric surgery. Currently there is no evidence of significant systemic infection which is good news. No fevers, chills, nausea, or vomiting noted at this time. Wound History Patient presents with 1 open wound that has been present for approximately 3 weeks. Patient has been treating wound in the following manner: neosporin and cover bandage. Laboratory tests have not been performed in the last month. Patient reportedly has tested positive for an antibiotic resistant organism. Patient reportedly has not tested positive for osteomyelitis. Patient reportedly has not had testing performed to evaluate circulation in the legs. Patient History Information obtained from Patient. Allergies clindamycin (Reaction: couldn't swallow) Family History Cancer - Siblings, Diabetes - Father,Paternal Grandparents, Heart Disease - Mother, Hypertension - Mother, No family history of Hereditary Spherocytosis, Kidney Disease, Lung Disease, Seizures, Stroke, Thyroid Problems, Tuberculosis. Social History Former smoker - 3 years, Alcohol Use - Never, Drug Use - No History, Caffeine Use - Daily. Medical History Eyes Patient has history of Cataracts Denies history of Glaucoma, Optic Neuritis Ear/Nose/Mouth/Throat Denies history of Chronic sinus problems/congestion, Middle ear problems Hematologic/Lymphatic Denies history of Anemia, Hemophilia, Human Immunodeficiency Virus, Lymphedema, Sickle Cell Disease Respiratory Monteleone, Jimya  ([id number]) Denies history of Aspiration, Asthma, Chronic Obstructive Pulmonary Disease (COPD), Pneumothorax, Sleep Apnea, Tuberculosis Cardiovascular Patient has history of Hypertension Denies history of Angina, Arrhythmia, Congestive Heart Failure, Coronary Artery Disease, Deep Vein Thrombosis, Hypotension, Myocardial Infarction, Peripheral Arterial Disease, Peripheral Venous Disease, Phlebitis, Vasculitis Gastrointestinal Denies history of Cirrhosis , Colitis, Crohn s, Hepatitis A, Hepatitis B, Hepatitis C Endocrine Patient has history of Type I Diabetes Denies history of Type II Diabetes Genitourinary Denies history of End Stage Renal Disease Immunological Denies history of Lupus Erythematosus, Raynaud s, Scleroderma Integumentary (Skin) Denies history of History of Burn, History of pressure wounds Musculoskeletal Patient has history of Osteoarthritis Denies history of Gout, Rheumatoid Arthritis, Osteomyelitis Neurologic Patient has history of Neuropathy Denies history of Dementia, Quadriplegia, Paraplegia, Seizure Disorder Oncologic Denies history of Received Chemotherapy, Received Radiation Psychiatric Denies history of Anorexia/bulimia, Confinement Anxiety Patient is treated with Insulin, Oral Agents. Blood sugar is not tested. Medical And Surgical History Notes Respiratory has a PRN inhaler but denies asthma/COPD or lung problems  Immunological pyoderma, lichen planus Review of Systems (ROS) Constitutional Symptoms (General Health) Denies complaints or symptoms of Fatigue, Fever, Chills, Marked Weight Change. Eyes Complains or has symptoms of Glasses / Contacts - glasses. Denies complaints or symptoms of Dry Eyes, Vision Changes. Ear/Nose/Mouth/Throat Denies complaints or symptoms of Difficult clearing ears, Sinusitis. Hematologic/Lymphatic Denies complaints or symptoms of Bleeding / Clotting Disorders, Human Immunodeficiency Virus. Respiratory Denies complaints or  symptoms of Chronic or frequent coughs, Shortness of Breath. Cardiovascular Denies complaints or symptoms of Chest pain, LE edema. Gastrointestinal Denies complaints or symptoms of Frequent diarrhea, Nausea, Vomiting. Endocrine Complains or has symptoms of Thyroid disease. Denies complaints or symptoms of Hepatitis, Polydypsia (Excessive Thirst). Genitourinary Denies complaints or symptoms of Kidney failure/ Dialysis, Incontinence/dribbling. Immunological Denies complaints or symptoms of Hives, Itching. Integumentary (Skin) Lagan, Roise ([id number]) Complains or has symptoms of Wounds. Denies complaints or symptoms of Bleeding or bruising tendency, Breakdown, Swelling. Musculoskeletal Denies complaints or symptoms of Muscle Pain, Muscle Weakness. Neurologic Denies complaints or symptoms of Numbness/parasthesias, Focal/Weakness. Psychiatric Denies complaints or symptoms of Anxiety, Claustrophobia. Objective Constitutional sitting or standing blood pressure is within target range for patient.. pulse regular and within target range for patient.Marland Kitchen respirations regular, non-labored and within target range for patient.Marland Kitchen temperature within target range for patient.. Well- nourished and well-hydrated in no acute distress. Vitals Time Taken: 3:19 PM, Height: 68 in, Source: Measured, Weight: 240 lbs, Source: Measured, BMI: 36.5, Temperature: 98.2 F, Pulse: 93 bpm, Respiratory Rate: 18 breaths/min, Blood Pressure: 116/69 mmHg. Eyes conjunctiva clear no eyelid edema noted. pupils equal round and reactive to light and accommodation. Ears, Nose, Mouth, and Throat no gross abnormality of ear auricles or external auditory canals. normal hearing noted during conversation. mucus membranes moist. Respiratory normal breathing without difficulty. clear to auscultation bilaterally. Cardiovascular regular rate and rhythm with normal S1, S2. 2+ dorsalis pedis/posterior tibialis pulses. no clubbing,  cyanosis, significant edema, Gastrointestinal (GI) soft, non-tender, non-distended, +BS. no ventral hernia noted. Musculoskeletal normal gait and posture. no significant deformity or arthritic changes, no loss or range of motion, no clubbing. Psychiatric this patient is able to make decisions and demonstrates good insight into disease process. Alert and Oriented x 3. pleasant and cooperative. General Notes: On inspection today patient did have some callous noted on both of the great toe plantar aspects which I did clean away in order to evaluate whether or not there was an open wound. Fortunately there did not appear to be the open wound at her location which was excellent news. There did not appear to be any drainage in all this is good news. Subsequently I did further evaluate her scalp region where it appears she may have a folliculitis which is for the most part resolving although she was never placed on any other antibiotics after being stopped as far as the clindamycin was concerned. Her primary care provider would like for her to be on something else and ask her to discuss this with me today and not to come see her tomorrow since essentially there's no need for her to be seen twice in just the same number of days Milling, Lenoir ([id number]) for the same issue. Currently there is some alopecia noted around the areas of infection. Integumentary (Hair, Skin) Wound #1 status is Healed - Epithelialized. Original cause of wound was Gradually Appeared. The wound is located on the Left Toe Great. The wound measures 0cm length x 0cm width x 0cm depth; 0cm^2 area and 0cm^3 volume. The wound is limited to skin  breakdown. There is no tunneling or undermining noted. There is a none present amount of drainage noted. The wound margin is flat and intact. There is no granulation within the wound bed. There is no necrotic tissue within the wound bed. The periwound skin appearance exhibited: Callus. The  periwound skin appearance did not exhibit: Crepitus, Excoriation, Induration, Rash, Scarring, Dry/Scaly, Maceration, Atrophie Blanche, Cyanosis, Ecchymosis, Hemosiderin Staining, Mottled, Pallor, Rubor, Erythema. Periwound temperature was noted as No Abnormality. Other Condition(s) Patient presents with Other Dermatologic Condition located on the Head. General Notes: patient with multiple scabbed areas on her scalp. the areas are slightly raised, non tender, no hair growth and slightly red in color. She states that she has abscesses on her head and was treated with antibiotics Patient presents with Other Dermatologic Condition located on the Right Foot. The skin appearance exhibited: Callus. 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 by STONE III, Wake Conlee E., PA-C. Post procedure Diagnosis Wound #: Same as Pre-Procedure Plan [facility]charge From [facility] Services: Discharge from Wound Care Center - Use foam on callus for first week, then use corn callus pads, wear diabetic shoes The following medication(s) was prescribed: Bactrim DS oral 800 mg-160 mg tablet 1 1 tablet oral taken 2 times a day for 14 days starting 02/17/2018 Angela Hubbard, Angela Hubbard ([id number]) At this point my suggestion is going to be that we go ahead and just past the area as well in regard to her great toes where she has some friction occurring at this point. I think that this will hopefully prevent any breakdown in opening at this time what ever was there previous seems to have completely resolved which is great news. At this point my suggestion is gonna be  that I go ahead and give her a prescription for Bactrim which I think will do well to clear up the remaining infection in regard to the scalp folliculitis. She's in agreement with this plan. Subsequently if anything worsens or changes she will let me know. Otherwise I'm just gonna treat this is a console today and we will not have a specific follow-up appointment for her I believe that the infection in the scalp region is resolving and in fact already seems to be significantly improved compared to the picture she showed me I think the Bactrim will help this to completely resolve. If anything changes you let me know where she can follow up with her primary care provider. We will also fax these notes to her printmaking provider. Otherwise it was a pleasure meeting her today and hopefully this will improve very rapidly. I did advise that she needs to wear corn/callous pads on the plantar aspect of her great toes in order to prevent friction and additional callous buildup which can lead to ulceration. She's in agreement with this plan. Electronic Signature(s) Signed: 02/17/2018 4:58:34 PM By: Lenda Kelp PA-C Entered By: Lenda Kelp on 02/17/2018 16:57:21 Angela Hubbard, Angela Hubbard ([id number]) -------------------------------------------------------------------------------- ROS/PFSH Details Patient Name: Angela Hubbard Date of Service: 02/17/2018 2:45 PM Medical Record Number: [id number] Patient Account Number: [id number] Date of Birth/Sex: 04-Feb-1965 (53 y.o. F) Treating RN: Curtis Sites Primary Care Provider: Beverley Fiedler Other Clinician: Referring Provider: Beverley Fiedler Treating Provider/Extender: Linwood Dibbles,  Kamil Mchaffie Weeks in Treatment: 0 Information Obtained From Patient Wound History Do you currently have one or more open woundso Yes How many open wounds do you currently haveo 1 Approximately how long have you had your woundso 3 weeks How have you been treating your wound(s) until  nowo neosporin and cover bandage Has your wound(s) ever healed and then re-openedo No Have you had any lab work done in the past montho No Have you tested positive for an antibiotic resistant organism (MRSA, VRE)o Yes Have you tested positive for osteomyelitis (bone infection)o No Have you had any tests for circulation on your legso No Constitutional Symptoms (General Health) Complaints and Symptoms: Negative for: Fatigue; Fever; Chills; Marked Weight Change Eyes Complaints and Symptoms: Positive for: Glasses / Contacts - glasses Negative for: Dry Eyes; Vision Changes Medical History: Positive for: Cataracts Negative for: Glaucoma; Optic Neuritis Ear/Nose/Mouth/Throat Complaints and Symptoms: Negative for: Difficult clearing ears; Sinusitis Medical History: Negative for: Chronic sinus problems/congestion; Middle ear problems Hematologic/Lymphatic Complaints and Symptoms: Negative for: Bleeding / Clotting Disorders; Human Immunodeficiency Virus Medical History: Negative for: Anemia; Hemophilia; Human Immunodeficiency Virus; Lymphedema; Sickle Cell Disease Respiratory Complaints and Symptoms: Negative for: Chronic or frequent coughs; Shortness of Breath Angela Hubbard, Angela Hubbard ([id number]) Medical History: Negative for: Aspiration; Asthma; Chronic Obstructive Pulmonary Disease (COPD); Pneumothorax; Sleep Apnea; Tuberculosis Past Medical History Notes: has a PRN inhaler but denies asthma/COPD or lung problems Cardiovascular Complaints and Symptoms: Negative for: Chest pain; LE edema Medical History: Positive for: Hypertension Negative for: Angina; Arrhythmia; Congestive Heart Failure; Coronary Artery Disease; Deep Vein Thrombosis; Hypotension; Myocardial Infarction; Peripheral Arterial Disease; Peripheral Venous Disease; Phlebitis; Vasculitis Gastrointestinal Complaints and Symptoms: Negative for: Frequent diarrhea; Nausea; Vomiting Medical History: Negative for: Cirrhosis ;  Colitis; Crohnos; Hepatitis A; Hepatitis B; Hepatitis C Endocrine Complaints and Symptoms: Positive for: Thyroid disease Negative for: Hepatitis; Polydypsia (Excessive Thirst) Medical History: Positive for: Type I Diabetes Negative for: Type II Diabetes Time with diabetes: 18 Treated with: Insulin, Oral agents Blood sugar tested every day: No Genitourinary Complaints and Symptoms: Negative for: Kidney failure/ Dialysis; Incontinence/dribbling Medical History: Negative for: End Stage Renal Disease Immunological Complaints and Symptoms: Negative for: Hives; Itching Medical History: Negative for: Lupus Erythematosus; Raynaudos; Scleroderma Past Medical History Notes: pyoderma, lichen planus Integumentary (Skin) Complaints and Symptoms: Positive for: Wounds Ungerer, Angela Hubbard ([id number]) Negative for: Bleeding or bruising tendency; Breakdown; Swelling Medical History: Negative for: History of Burn; History of pressure wounds Musculoskeletal Complaints and Symptoms: Negative for: Muscle Pain; Muscle Weakness Medical History: Positive for: Osteoarthritis Negative for: Gout; Rheumatoid Arthritis; Osteomyelitis Neurologic Complaints and Symptoms: Negative for: Numbness/parasthesias; Focal/Weakness Medical History: Positive for: Neuropathy Negative for: Dementia; Quadriplegia; Paraplegia; Seizure Disorder Psychiatric Complaints and Symptoms: Negative for: Anxiety; Claustrophobia Medical History: Negative for: Anorexia/bulimia; Confinement Anxiety Oncologic Medical History: Negative for: Received Chemotherapy; Received Radiation HBO Extended History Items Eyes: Cataracts Immunizations Pneumococcal Vaccine: Received Pneumococcal Vaccination: Yes Immunization Notes: up to date Implantable Devices Family and Social History Cancer: Yes - Siblings; Diabetes: Yes - Father,Paternal Grandparents; Heart Disease: Yes - Mother; Hereditary Spherocytosis: No; Hypertension: Yes -  Mother; Kidney Disease: No; Lung Disease: No; Seizures: No; Stroke: No; Thyroid Problems: No; Tuberculosis: No; Former smoker - 3 years; Alcohol Use: Never; Drug Use: No History; Caffeine Use: Daily; Financial Concerns: No; Food, Clothing or Shelter Needs: No; Support System Lacking: No; Transportation Concerns: No; Advanced Directives: No; Patient does not want information on Advanced Directives Electronic Signature(s) Signed: 02/17/2018 4:47:13 PM By: Moises Blood, Billiejo ([id number]) Signed: 02/17/2018 4:58:34 PM  By: Lenda Kelp PA-C Entered By: Curtis Sites on 02/17/2018 15:38:20 Angela Hubbard ([id number]) -------------------------------------------------------------------------------- SuperBill Details Patient Name: Angela Hubbard Date of Service: 02/17/2018 Medical Record Number: [id number] Patient Account Number: [id number] Date of Birth/Sex: [birth year] (53 y.o. 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.8 Other specified follicular disorders E11.40 Type 2 diabetes mellitus with diabetic neuropathy, unspecified I10 Essential (primary) hypertension Z98.84 Bariatric surgery status Facility Procedures CPT4 Code: 78295621 Description: 99213 - WOUND CARE VISIT-LEV 3 EST PT Modifier: Quantity: 1 CPT4 Code: 30865784 Description: 11055 - PARE BENIGN LES; SGL ICD-10 Diagnosis Description L97.522 Non-pressure chronic ulcer of other part of left foot with fat Modifier: layer exposed Quantity: 1 Physician Procedures CPT4 Code: 6962952 Description: WC PHYS LEVEL 3 o NEW PT ICD-10 Diagnosis Description E11.621 Type 2 diabetes mellitus with  foot ulcer L97.522 Non-pressure chronic ulcer of other part of left foot with fat L73.8 Other specified follicular disorders E11.40 Type 2 diabetes  mellitus with diabetic neuropathy, unspecified Modifier: 25 layer exposed Quantity: 1 CPT4 Code: 8413244 Description: 11055 - WC PHYS PARE BENIGN LES; SGL ICD-10 Diagnosis Description L97.522 Non-pressure chronic ulcer of other part of left foot with fat Modifier: layer exposed Quantity: 1 Electronic Signature(s) Signed: 02/17/2018 4:58:34 PM By: Lenda Kelp PA-C Entered By: Lenda Kelp on 02/17/2018 16:57:46

## 2018-04-13 ENCOUNTER — Ambulatory Visit (INDEPENDENT_AMBULATORY_CARE_PROVIDER_SITE_OTHER): Payer: Medicaid Other | Admitting: Podiatry

## 2018-04-13 ENCOUNTER — Other Ambulatory Visit: Payer: Self-pay

## 2018-04-13 ENCOUNTER — Encounter: Payer: Self-pay | Admitting: Podiatry

## 2018-04-13 VITALS — BP 112/70 | HR 90

## 2018-04-13 DIAGNOSIS — M216X1 Other acquired deformities of right foot: Secondary | ICD-10-CM | POA: Diagnosis not present

## 2018-04-13 DIAGNOSIS — M2041 Other hammer toe(s) (acquired), right foot: Secondary | ICD-10-CM | POA: Diagnosis not present

## 2018-04-13 DIAGNOSIS — R234 Changes in skin texture: Secondary | ICD-10-CM

## 2018-04-13 DIAGNOSIS — E1142 Type 2 diabetes mellitus with diabetic polyneuropathy: Secondary | ICD-10-CM | POA: Diagnosis not present

## 2018-04-13 DIAGNOSIS — Q828 Other specified congenital malformations of skin: Secondary | ICD-10-CM

## 2018-04-13 NOTE — Progress Notes (Signed)
This patient presents the office with multiple complaints on the bottom of both feet.  She says she has been diabetic for years and has neuropathy.  She says she has a painful callus on the outside ball of her right foot which is painful walking wearing her shoes.  She also relates that the fifth toe on the right foot has become red and swollen over the last few weeks but has improved today.  She also has skin lesions noted under the big toes on both feet.  She has a open fissure noted under the right big toe with no evidence of any swelling or drainage pus or infection.  She presents the office today for an evaluation of her feet.    Vascular  Dorsalis pedis  pulses are palpable  B/L.  Posterior tibial pulses are absent  B/L.  Capillary return  WNL.  Temperature gradient is  WNL.  Skin turgor  WNL  Sensorium  Senn Weinstein monofilament wire  Absent.. Absent  tactile sensation.  Nail Exam  Patient has normal nails with no evidence of bacterial or fungal infection.  Orthopedic  Exam  Muscle tone and muscle strength  WNL.  No limitations of motion feet  B/L.  No crepitus or joint effusion noted.  Foot type is unremarkable and digits show no abnormalities.  Plantar flexed fifth metatarsal right foot.  Contracted fifth toe right foot.  Skin  No open lesions.  Normal skin texture and turgor. IPJ callus  Hallux  B/l.  Fissure running transversely under right hallux.  No infection or drainage noted.  Porokeratosis sub 5th met right foot.  Contracted fifth toe right foot.  Fissure right hallux.  IE.  Debride porokeratosis  Right foot.  Neosporin/DSD [place] hallux  B/l.  RTC 6 months.  Patient to soak right foot in epsom salts and rebandage at home.  Call the office if any problems.     DPM 

## 2018-07-27 ENCOUNTER — Telehealth: Payer: Self-pay | Admitting: *Deleted

## 2018-07-27 ENCOUNTER — Other Ambulatory Visit: Payer: Self-pay

## 2018-07-27 ENCOUNTER — Encounter: Payer: Self-pay | Admitting: Podiatry

## 2018-07-27 ENCOUNTER — Ambulatory Visit (INDEPENDENT_AMBULATORY_CARE_PROVIDER_SITE_OTHER): Payer: Medicaid Other | Admitting: Podiatry

## 2018-07-27 DIAGNOSIS — E1042 Type 1 diabetes mellitus with diabetic polyneuropathy: Secondary | ICD-10-CM

## 2018-07-27 DIAGNOSIS — B351 Tinea unguium: Secondary | ICD-10-CM | POA: Diagnosis not present

## 2018-07-27 DIAGNOSIS — M79675 Pain in left toe(s): Secondary | ICD-10-CM | POA: Diagnosis not present

## 2018-07-27 DIAGNOSIS — M205X2 Other deformities of toe(s) (acquired), left foot: Secondary | ICD-10-CM | POA: Insufficient documentation

## 2018-07-27 DIAGNOSIS — M2042 Other hammer toe(s) (acquired), left foot: Secondary | ICD-10-CM

## 2018-07-27 DIAGNOSIS — E1140 Type 2 diabetes mellitus with diabetic neuropathy, unspecified: Secondary | ICD-10-CM | POA: Insufficient documentation

## 2018-07-27 NOTE — Telephone Encounter (Signed)
Pt states she is diabetic and her toe is purple and has opened up. I informed scheduler and pt is to be scheduled today.

## 2018-07-27 NOTE — Progress Notes (Signed)
This patient presents the office with multiple complaints on the bottom of both feet.  She says she has been diabetic for years and has neuropathy.   .  She also has skin lesions noted under the big toes on both feet.  She has a open fissure noted under the right big toe with no evidence of any swelling or drainage pus or infection both feet.  This patient presents to the office today very concerned about the tip of her second toe left foot.  She says she is diabetic and takes excellent care of her feet.  She says she frequently develops purplish discoloration noted on the tip of the second toe left foot.  She presents the office mainly for this second toe left foot for an evaluation and treatment.  Vascular  Dorsalis pedis  pulses are palpable  B/L.  Posterior tibial pulses are absent  B/L.  Capillary return  WNL.  Temperature gradient is  WNL.  Skin turgor  WNL  Sensorium  Senn Weinstein monofilament wire  absent.. Absent  tactile sensation.  Nail Exam  Patient has normal nails with no evidence of bacterial or fungal infection. Patient has thickened non painful nail second toe left foot.  Orthopedic  Exam  Muscle tone and muscle strength  WNL.  No limitations of motion feet  B/L.  No crepitus or joint effusion noted.  Foot type is unremarkable and digits show no abnormalities.  Plantar flexed fifth metatarsal right foot.  Contracted fifth toe right foot.  Hammer toe second left and mallet toe left foot.  Skin  No open lesions.  Normal skin texture and turgor. IPJ callus  Hallux  B/l.  Fissure running transversely under right hallux.  No infection or drainage noted. Dark brownish discoloration subungually distal second toenail.    Hammer /mallet toe second right.  Onychomycosis second left foot.  Fissure right hallux and left hallux.  ROV.  Discussed this condition with this patient.  Told her that she is developed a hammertoe and mallet toe second toe left foot.  In combination to the thickened  second toenail she has developed trauma developing at the distal aspect of the nail plate second toe left.  No evidence of any infection or ulceration noted.  Told this patient she would benefit from being evaluated for flexor tenotomy by Dr. March Rummage in [place].  She also has a pinch callus on the left hallux which she could also discuss with Dr. March Rummage.  RTC prn.     Gardiner Barefoot DPM

## 2018-08-07 ENCOUNTER — Ambulatory Visit (INDEPENDENT_AMBULATORY_CARE_PROVIDER_SITE_OTHER): Payer: Medicaid Other

## 2018-08-07 ENCOUNTER — Other Ambulatory Visit: Payer: Self-pay

## 2018-08-07 ENCOUNTER — Ambulatory Visit: Payer: Medicaid Other | Admitting: Podiatry

## 2018-08-07 ENCOUNTER — Encounter: Payer: Self-pay | Admitting: Podiatry

## 2018-08-07 VITALS — Temp 97.1°F

## 2018-08-07 DIAGNOSIS — M79671 Pain in right foot: Secondary | ICD-10-CM

## 2018-08-07 DIAGNOSIS — M2041 Other hammer toe(s) (acquired), right foot: Secondary | ICD-10-CM

## 2018-08-07 DIAGNOSIS — M216X1 Other acquired deformities of right foot: Secondary | ICD-10-CM

## 2018-08-07 DIAGNOSIS — M6240 Contracture of muscle, unspecified site: Secondary | ICD-10-CM

## 2018-08-07 DIAGNOSIS — M79672 Pain in left foot: Secondary | ICD-10-CM

## 2018-08-07 DIAGNOSIS — M2042 Other hammer toe(s) (acquired), left foot: Secondary | ICD-10-CM

## 2018-08-07 MED ORDER — CEPHALEXIN 500 MG PO CAPS: 500.0000 mg | ORAL_CAPSULE | Freq: Two times a day (BID) | ORAL | 0 refills | Status: DC

## 2018-08-07 MED ORDER — NEOMYCIN-POLYMYXIN-HC 3.5-10000-1 OT SOLN: OTIC | 1 refills | Status: DC

## 2018-08-17 ENCOUNTER — Telehealth: Payer: Self-pay | Admitting: *Deleted

## 2018-08-17 NOTE — Telephone Encounter (Signed)
Pt called states she had surgery for a cut tendon and was given 3 days of keflex and still has infection. 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

## 2018-08-21 ENCOUNTER — Other Ambulatory Visit: Payer: Self-pay | Admitting: Podiatry

## 2018-08-21 DIAGNOSIS — M2041 Other hammer toe(s) (acquired), right foot: Secondary | ICD-10-CM

## 2018-08-23 ENCOUNTER — Other Ambulatory Visit: Payer: Self-pay | Admitting: Podiatry

## 2018-08-24 MED ORDER — CEPHALEXIN 500 MG PO CAPS: 500.0000 mg | ORAL_CAPSULE | Freq: Two times a day (BID) | ORAL | 0 refills | Status: AC

## 2018-08-24 NOTE — Progress Notes (Signed)
Subjective:  Patient ID: Angela Hubbard, female    DOB: [birth year],  MRN: [id number]  Chief Complaint  Patient presents with  . Foot Pain    toe pain, on the right foot second toe, and the left foot great toe, pt has neuropathy so she can't feel much pain in the foot, pt has tried trimming them, but it has not helped    53 y.o. female presents with the above complaint. Hx as above.  Review of Systems: Negative except as noted in the HPI. Denies N/V/F/Ch.  Past Medical History:  Diagnosis Date  . Anemia   . Autoimmune disorder ([place])   . Diabetes mellitus without complication ([place])   . Family history of BRCA2 gene positive   . Family history of breast cancer   . Hyperlipidemia   . Hypertension   . Morbid obesity ([place])   . Neuropathy   . Peripheral edema    chronic  . Type 1 diabetes ([place])   . Ulcerative colitis ([place])     Current Outpatient Medications:  .  ACCU-CHEK AVIVA PLUS test strip, , Disp: , Rfl:  .  Accu-Chek FastClix Lancets MISC, U TO MONITOR BG TID, Disp: , Rfl:  .  albuterol (PROVENTIL HFA;VENTOLIN HFA) 108 (90 Base) MCG/ACT inhaler, Inhale into the lungs., Disp: , Rfl:  .  amitriptyline (ELAVIL) 50 MG tablet, Take by mouth., Disp: , Rfl:  .  atorvastatin (LIPITOR) 40 MG tablet, Take 40 mg by mouth daily., Disp: , Rfl:  .  BD PEN NEEDLE NANO U/F 32G X 4 MM MISC, U BID, Disp: , Rfl:  .  clobetasol (TEMOVATE) 0.05 % external solution, APP EXT AA WITH LESIONS BID, Disp: , Rfl:  .  cyclobenzaprine (FLEXERIL) 10 MG tablet, , Disp: , Rfl:  .  fluticasone (FLONASE) 50 MCG/ACT nasal spray, , Disp: , Rfl:  .  gemfibrozil (LOPID) 600 MG tablet, Take 600 mg by mouth 2 (two) times daily before a meal., Disp: , Rfl:  .  glucose blood test strip, U TID, Disp: , Rfl:  .  HYDROcodone-acetaminophen (NORCO/VICODIN) 5-325 MG tablet, TK 1 T PO Q 8 H PRF SEVERE PAIN, Disp: , Rfl:  .  LANTUS SOLOSTAR 100 UNIT/ML Solostar Pen, INJ 30 UNI [place] HS. DISCONTINUE HUMALOG, Disp: , Rfl:  .   levothyroxine (SYNTHROID, LEVOTHROID) 75 MCG tablet, TK 1 T PO QD IN THE MORNING OES, Disp: , Rfl:  .  metFORMIN (GLUCOPHAGE) 1000 MG tablet, Take by mouth., Disp: , Rfl:  .  Multiple Vitamin (MULTI-VITAMIN) tablet, Take by mouth., Disp: , Rfl:  .  Multiple Vitamins-Minerals (MULTIVITAMIN ADULT PO), Take by mouth., Disp: , Rfl:  .  nystatin (MYCOSTATIN/NYSTOP) powder, APP EXT AA BID, Disp: , Rfl:  .  omeprazole (PRILOSEC) 40 MG capsule, Take 40 mg by mouth daily., Disp: , Rfl:  .  pregabalin (LYRICA) 150 MG capsule, Take by mouth., Disp: , Rfl:  .  VICTOZA 18 MG/3ML SOPN, INJECT 1.8MG [place] D, Disp: , Rfl:  .  cephALEXin (KEFLEX) 500 MG capsule, Take 1 capsule (500 mg total) by mouth 2 (two) times daily., Disp: 6 capsule, Rfl: 0  Social History   Tobacco Use  Smoking Status Current Every Day Smoker  . Packs/day: 0.50  . Years: 30.00  . Pack years: 15.00  . Types: Cigarettes  Smokeless Tobacco Never Used    No Known Allergies Objective:   Vitals:   08/07/18 0941  Temp: (!) 97.1 F (36.2 C)   There is no  height or weight on file to calculate BMI. Constitutional Well developed. Well nourished.  Vascular Dorsalis pedis pulses palpable bilaterally. Posterior tibial pulses palpable bilaterally. Capillary refill normal to all digits.  No cyanosis or clubbing noted. Pedal hair growth normal.  Neurologic Normal speech. Oriented to person, place, and time. Epicritic sensation to light touch grossly present bilaterally.  Dermatologic No skin lesions. Ingrown toenail left great toe  Orthopedic: Semi-reducible hammertoe right 2nd toe   Radiographs: None Assessment:   1. Contracture, tendon sheath   2. Hammertoe of right foot    Plan:  Patient was evaluated and treated and all questions answered.  Hammertoe, semi-reducible right 2nd toe -Flexor tenotomy as below -Advised to remove dressing tomorrow and apply antibiotic ointment and Band-Aid daily  Procedure: Flexor Tenotomy  Indication for Procedure: toe with semi-reducible hammertoe with distal tip ulceration. Flexor tenotomy indicated to alleviate contracture, reduce pressure, and enhance healing of the ulceration. Location: right, 2nd toe Anesthesia: Lidocaine 1% plain; 1.5 mL and Marcaine 0.5% plain; 1.5 mL digital block Instrumentation: 18 g needle  Technique: The toe was anesthetized as above and prepped in the usual fashion. The toe was exsanquinated and a tourniquet was secured at the base of the toe. An 18g needle was then used to percutaneously release the flexor tendon at the plantar surface of the toe with noted release of the hammertoe deformity. The incision was then dressed with antibiotic ointment and band-aid. Compression splint dressing applied. Patient tolerated the procedure well. Dressing: Dry, sterile, compression dressing. Disposition: Patient tolerated procedure well. Patient to return in 1 week for follow-up.   Ingrown Toenail left great toe -Ingrown nail debrided in slant back fashion.  Return in about 1 week (around 08/14/2018) for 1 week nail check and flexor tenotomy f/u .

## 2018-08-25 ENCOUNTER — Other Ambulatory Visit: Payer: Self-pay

## 2018-08-25 ENCOUNTER — Telehealth (INDEPENDENT_AMBULATORY_CARE_PROVIDER_SITE_OTHER): Payer: Medicaid Other | Admitting: Podiatry

## 2018-08-25 DIAGNOSIS — M79676 Pain in unspecified toe(s): Secondary | ICD-10-CM

## 2018-08-25 DIAGNOSIS — L600 Ingrowing nail: Secondary | ICD-10-CM

## 2018-08-25 NOTE — Telephone Encounter (Signed)
I called pt and asked how she was doing and if she had picked up the antibiotic. Pt states she did pick up the antibiotic and did need to make a follow up appt, like she saw on her check out sheet. I transferred pt to schedulers.

## 2018-08-25 NOTE — Telephone Encounter (Signed)
Pt called states she was able to keep her appt for Friday and she was able to discuss with Dr. March Rummage and thanked me for my assistance.

## 2018-08-25 NOTE — Progress Notes (Signed)
Virtual Visit via Video Note  I connected with Angela Hubbard on 08/25/18 at  2:00 PM EDT by a video enabled telemedicine application and verified that I am speaking with the correct person using two identifiers.  Location: Patient: home Provider: office   I discussed the limitations of evaluation and management by telemedicine and the availability of in person appointments. The patient expressed understanding and agreed to proceed.  History of Present Illness: Called because toes were "turning black". Noticed after soaking. States they look much better. Has been soaking in black castor oil and tea tree oil.   Observations/Objective: Right hallux with medial ingrown nail border. No evident drainage Right 2nd toenail healing well no sign of infection of surgical site but with distal callus noted.  Assessment and Plan: Ingrown Nail without acute infection -F/u this week for procedure -Rx keflex  S/p Flexor tenotomy -healing well without acute infeciton  Follow Up Instructions: F/u this week for ingrown nail procedure   I discussed the assessment and treatment plan with the patient. The patient was provided an opportunity to ask questions and all were answered. The patient agreed with the plan and demonstrated an understanding of the instructions.   The patient was advised to call back or seek an in-person evaluation if the symptoms worsen or if the condition fails to improve as anticipated.  I provided 12 minutes of non-face-to-face time during this encounter.   Evelina Bucy, DPM

## 2018-08-28 ENCOUNTER — Ambulatory Visit: Payer: Medicaid Other | Admitting: Podiatry

## 2018-08-28 ENCOUNTER — Other Ambulatory Visit: Payer: Self-pay

## 2018-08-28 VITALS — Temp 98.0°F

## 2018-08-28 DIAGNOSIS — L600 Ingrowing nail: Secondary | ICD-10-CM | POA: Diagnosis not present

## 2018-08-28 DIAGNOSIS — M2042 Other hammer toe(s) (acquired), left foot: Secondary | ICD-10-CM

## 2018-08-28 NOTE — Progress Notes (Signed)
Subjective:  Patient ID: Angela Hubbard, female    DOB: [birth year],  MRN: [id number]  Chief Complaint  Patient presents with  . Nail Problem    Pt states left 1st nail lateral border 'possible ingrown' denies fever/nausea/vomiting/chills, pt states may be painful but doesn't know due to lack of sensation in feet.    53 y.o. female presents with the above complaint. Hx as above  States left 2nd toe doing very well denies issues.  Review of Systems: Negative except as noted in the HPI. Denies N/V/F/Ch.  Past Medical History:  Diagnosis Date  . Anemia   . Autoimmune disorder ([place])   . Diabetes mellitus without complication ([place])   . Family history of BRCA2 gene positive   . Family history of breast cancer   . Hyperlipidemia   . Hypertension   . Morbid obesity ([place])   . Neuropathy   . Peripheral edema    chronic  . Type 1 diabetes ([place])   . Ulcerative colitis ([place])     Current Outpatient Medications:  .  ACCU-CHEK AVIVA PLUS test strip, , Disp: , Rfl:  .  Accu-Chek FastClix Lancets MISC, U TO MONITOR BG TID, Disp: , Rfl:  .  albuterol (PROVENTIL HFA;VENTOLIN HFA) 108 (90 Base) MCG/ACT inhaler, Inhale into the lungs., Disp: , Rfl:  .  amitriptyline (ELAVIL) 50 MG tablet, Take by mouth., Disp: , Rfl:  .  atorvastatin (LIPITOR) 40 MG tablet, Take 40 mg by mouth daily., Disp: , Rfl:  .  BD PEN NEEDLE NANO U/F 32G X 4 MM MISC, U BID, Disp: , Rfl:  .  cephALEXin (KEFLEX) 500 MG capsule, TAKE 1 CAPSULE(500 MG) BY MOUTH TWICE DAILY, Disp: 6 capsule, Rfl: 0 .  cephALEXin (KEFLEX) 500 MG capsule, Take 1 capsule (500 mg total) by mouth 2 (two) times daily., Disp: 6 capsule, Rfl: 0 .  clobetasol (TEMOVATE) 0.05 % external solution, APP EXT AA WITH LESIONS BID, Disp: , Rfl:  .  cyclobenzaprine (FLEXERIL) 10 MG tablet, , Disp: , Rfl:  .  fluticasone (FLONASE) 50 MCG/ACT nasal spray, , Disp: , Rfl:  .  gemfibrozil (LOPID) 600 MG tablet, Take 600 mg by mouth 2 (two) times daily before a  meal., Disp: , Rfl:  .  glucose blood test strip, U TID, Disp: , Rfl:  .  HYDROcodone-acetaminophen (NORCO/VICODIN) 5-325 MG tablet, TK 1 T PO Q 8 H PRF SEVERE PAIN, Disp: , Rfl:  .  LANTUS SOLOSTAR 100 UNIT/ML Solostar Pen, INJ 30 UNI [place] HS. DISCONTINUE HUMALOG, Disp: , Rfl:  .  levothyroxine (SYNTHROID, LEVOTHROID) 75 MCG tablet, TK 1 T PO QD IN THE MORNING OES, Disp: , Rfl:  .  metFORMIN (GLUCOPHAGE) 1000 MG tablet, Take by mouth., Disp: , Rfl:  .  Multiple Vitamin (MULTI-VITAMIN) tablet, Take by mouth., Disp: , Rfl:  .  Multiple Vitamins-Minerals (MULTIVITAMIN ADULT PO), Take by mouth., Disp: , Rfl:  .  neomycin-polymyxin-hydrocortisone (CORTISPORIN) OTIC solution, APPLY 1-2 DROPS TO TOE BID AFTER SOAKING, Disp: , Rfl:  .  nystatin (MYCOSTATIN/NYSTOP) powder, APP EXT AA BID, Disp: , Rfl:  .  omeprazole (PRILOSEC) 40 MG capsule, Take 40 mg by mouth daily., Disp: , Rfl:  .  pregabalin (LYRICA) 150 MG capsule, Take by mouth., Disp: , Rfl:  .  VICTOZA 18 MG/3ML SOPN, INJECT 1.8MG [place] D, Disp: , Rfl:   Social History   Tobacco Use  Smoking Status Current Every Day Smoker  . Packs/day: 0.50  . Years: 30.00  .  Pack years: 15.00  . Types: Cigarettes  Smokeless Tobacco Never Used    No Known Allergies Objective:   Vitals:   08/28/18 1124  Temp: 98 F (36.7 C)   There is no height or weight on file to calculate BMI. Constitutional Well developed. Well nourished.  Vascular Dorsalis pedis pulses palpable bilaterally. Posterior tibial pulses palpable bilaterally. Capillary refill normal to all digits.  No cyanosis or clubbing noted. Pedal hair growth normal.  Neurologic Normal speech. Oriented to person, place, and time. Epicritic sensation to light touch grossly present bilaterally.  Dermatologic Painful ingrowing nail at both nail borders of the hallux nail bilaterally. 2nd toe healing well no warmth erythema signs of infection.  Orthopedic: Normal joint ROM without pain or  crepitus bilaterally. L 2nd toe rectus.   Radiographs: None Assessment:   1. Hammer toe of second toe of left foot   2. Ingrown nail    Plan:  Patient was evaluated and treated and all questions answered.  S/p Flexor tenotomy left -Healing well.  Ingrown Nail, bilaterally -Ingrown nail removed left. Aggressive slant back right.  Procedure: Avulsion of toenail Location: Left 1st toe  Anesthesia: Lidocaine 1% plain; 1.5 mL and Marcaine 0.5% plain; 1.5 mL, digital block. Skin Prep: Betadine. Dressing: Silvadene; telfa; dry, sterile, compression dressing. Technique: Following skin prep,both nail borders were split with English anvil and avulsed with hemostat. The area was cleansed. Sterile dressing applied. Disposition: Patient tolerated procedure well.    Return in about 1 week (around 09/04/2018) for Nail Check with Nurse.

## 2018-08-28 NOTE — Patient Instructions (Signed)

## 2018-09-04 ENCOUNTER — Ambulatory Visit (INDEPENDENT_AMBULATORY_CARE_PROVIDER_SITE_OTHER): Payer: Medicaid Other | Admitting: Podiatry

## 2018-09-04 ENCOUNTER — Other Ambulatory Visit: Payer: Self-pay

## 2018-09-04 VITALS — Temp 98.0°F

## 2018-09-04 DIAGNOSIS — L600 Ingrowing nail: Secondary | ICD-10-CM | POA: Diagnosis not present

## 2018-09-04 DIAGNOSIS — M79676 Pain in unspecified toe(s): Secondary | ICD-10-CM

## 2018-09-18 ENCOUNTER — Ambulatory Visit (INDEPENDENT_AMBULATORY_CARE_PROVIDER_SITE_OTHER): Payer: Medicaid Other | Admitting: Podiatry

## 2018-09-18 ENCOUNTER — Other Ambulatory Visit: Payer: Self-pay

## 2018-09-18 DIAGNOSIS — L600 Ingrowing nail: Secondary | ICD-10-CM | POA: Diagnosis not present

## 2018-10-05 NOTE — Progress Notes (Signed)
  Subjective:  Patient ID: Angela Hubbard, female    DOB: 06/02/1965,  MRN: [id number]  Chief Complaint  Patient presents with  . Foot Problem    Pt states left 1st toe is draining pus from medial wound.   . Nail Problem    Bilateral ingrown nail 1st toenail check. Pt states she believes they are infected, and that she cannot change bandages on her own due to back pain.   The patient presents for followup.  States that the left great toe is draining pus from the inside area of the toe, not from the nail.  Believes that area is infected.  States that she cannot change the bandages herself and reports that her legs are swollen to both feet.  Objective:   Constitutional Well developed. Well nourished.  Vascular Dorsalis pedis pulses palpable bilaterally. Posterior tibial pulses palpable bilaterally. Capillary refill normal to all digits. No cyanosis or clubbing noted. Pedal hair growth normal.  Neurologic Gross sensation diminished.  Protective sensation absent.  Dermatologic Left great toe without purulent drainage.  No warmth, no erythema.  Slight ingrowing nail.   Radiographs:   Assessment/Plan:  Patient was evaluated and treated and all questions answered.  1.  Left great toe ingrowing nail. - Nail gently debrided in slant back fashion. - No signs of acute infection noted. - Antibiotic ointment and Band-Aid applied. - Patient to continue soaking and apply antibiotic ointment. - Follow up in several weeks for recheck.  Return in about 2 weeks (around 09/18/2018) for Ingrown nail f/u .

## 2018-10-05 NOTE — Progress Notes (Signed)
  Subjective:  Patient ID: Angela Hubbard, female    DOB: 03-12-1965,  MRN: [id number]  Chief Complaint  Patient presents with  . Nail Problem    Left 1st nail follow up. Pt states healing well with no concerns. Denies drainage.   53 y.o. female returns for the above complaint.   Objective:   General AA&O x3. Normal mood and affect.  Vascular Foot warm and well perfused with good capillary refill.  Neurologic Sensation grossly diminished.  Dermatologic Left hallux nail without drainage or erythema. No signs of local infection.  Orthopedic: No tenderness to palpation of the toe.   Assessment & Plan:  Patient was evaluated and treated and all questions answered.  S/p Ingrown Toenail Excision, left -Healing well without issue. -Discussed return precautions. -F/u PRN

## 2018-10-12 ENCOUNTER — Ambulatory Visit: Payer: Medicaid Other | Admitting: Podiatry

## 2018-10-12 ENCOUNTER — Other Ambulatory Visit: Payer: Self-pay

## 2018-10-12 ENCOUNTER — Encounter: Payer: Self-pay | Admitting: Podiatry

## 2018-10-12 DIAGNOSIS — E1042 Type 1 diabetes mellitus with diabetic polyneuropathy: Secondary | ICD-10-CM | POA: Diagnosis not present

## 2018-10-12 DIAGNOSIS — L84 Corns and callosities: Secondary | ICD-10-CM | POA: Diagnosis not present

## 2018-10-12 DIAGNOSIS — Q828 Other specified congenital malformations of skin: Secondary | ICD-10-CM

## 2018-10-12 NOTE — Progress Notes (Signed)
This patient returns to the office follow-up for her feet.  She says she is concerned about the nails and the calluses that are on both feet.  She says that she is neuropathic and has poor feeling in both her feet.  She was seen by Dr. March Rummage who performed a flexor tenotomy of the second toe left foot.  She says that this toe has become straight but it does no longer been.  She is pleased with this correction.  She presents the office today for follow-up preventative foot care services for her neuropathic feet.  Vascular  Dorsalis pedis and posterior tibial pulses are palpable  B/L.  Capillary return  WNL.  Temperature gradient is  WNL.  Skin turgor  WNL  Sensorium  Senn Weinstein monofilament wire  WNL. Normal tactile sensation.  Nail Exam  Patient has normal nails with no evidence of bacterial or fungal infection.  Orthopedic  Exam  Muscle tone and muscle strength  WNL.  No limitations of motion feet  B/L.  No crepitus or joint effusion noted.  Foot type is unremarkable and digits show no abnormalities.  Bony prominences are unremarkable. Mildly dorsiflexed second toe left foot.  Skin  No open lesions.  Normal skin texture and turgor. Pinch callus  B/L.  Porokeratosis sub 5 right foot.  Porokeratosis right.  Pinch callus  B/L.    Debridement of callus  B/L.  No nail work needed today.  RTC 3 months.   Gardiner Barefoot DPM

## 2018-12-31 IMAGING — MG 2D DIGITAL DIAGNOSTIC BILATERAL MAMMOGRAM WITH CAD AND ADJUNCT T
8 of 18 series · 8 of 40 positions shown · non-contrast
Comparison: Previous exam(s).

ACR Breast Density Category a: The breast tissue is almost entirely
fatty.

CLINICAL DATA: The patient presents with a palpable lump in the
medial right breast. The patient's half sister was recently
diagnosed with breast cancer and is BRCA 1 positive. The patient has
not been genetically tested.

EXAM:
2D DIGITAL DIAGNOSTIC BILATERAL MAMMOGRAM WITH CAD AND ADJUNCT TOMO
ULTRASOUND RIGHT BREAST

[R CC (1 of 3)]
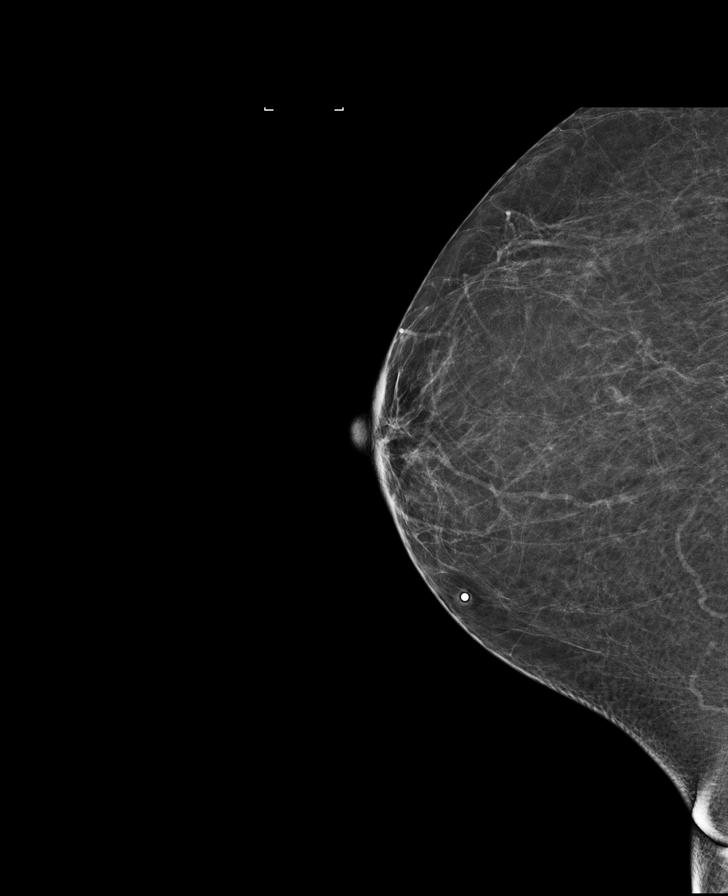

[R MLO (1 of 2)]
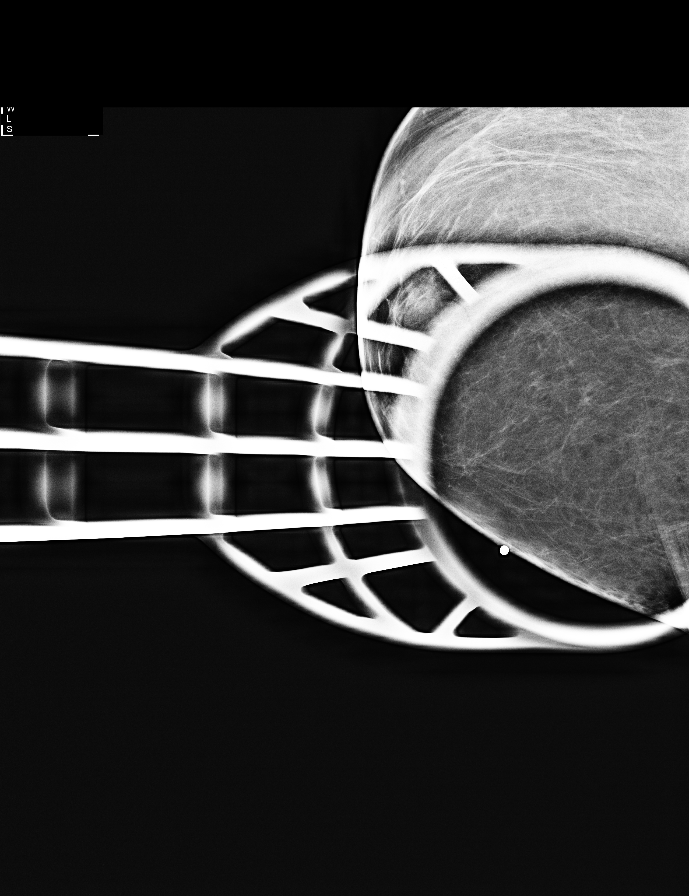

[L MLO synth-2D]
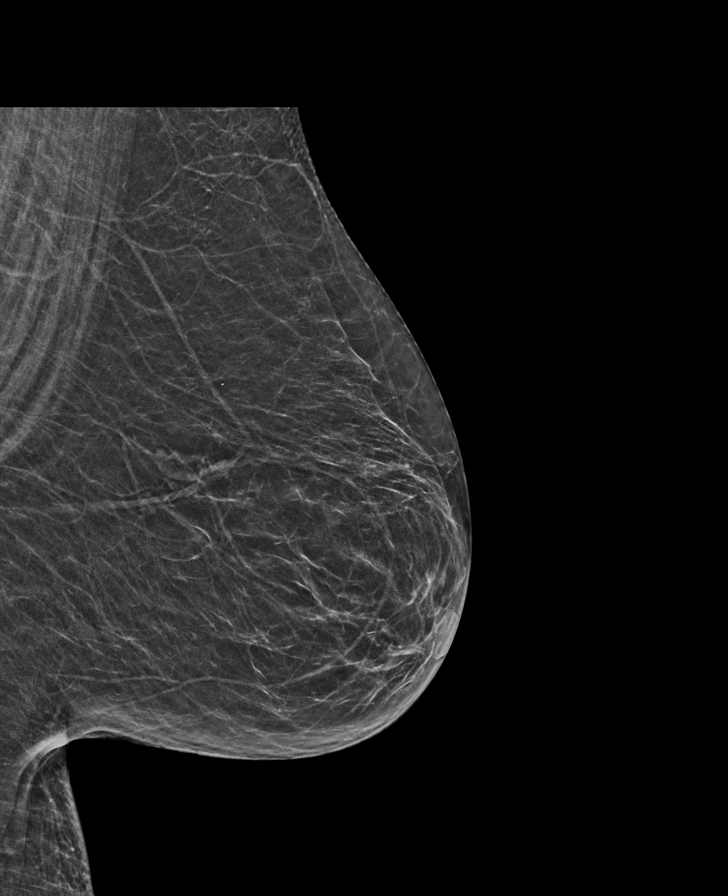

[R MLO (2 of 2)]
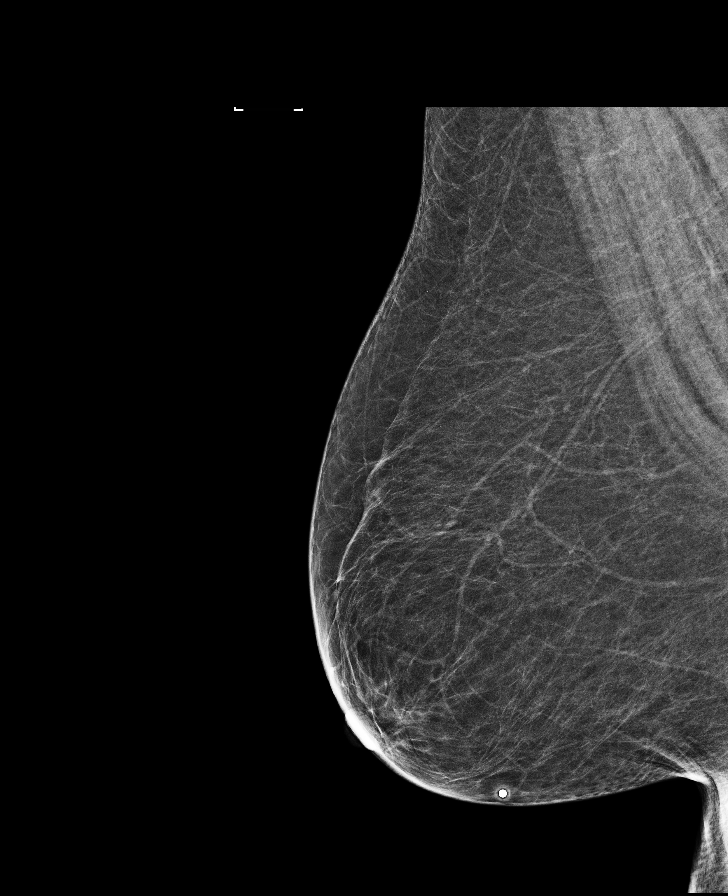

[R CC (2 of 3)]
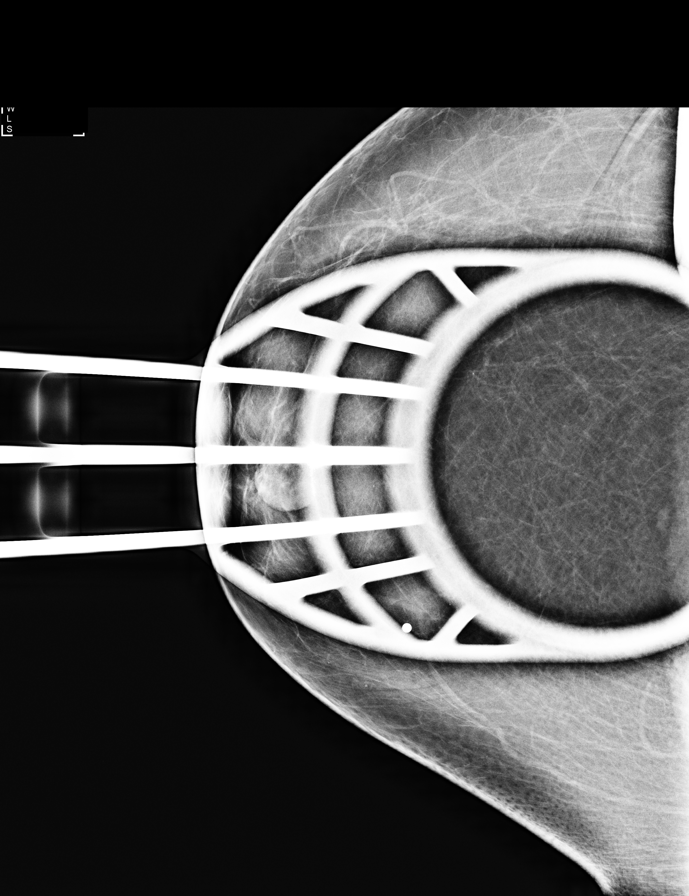

[R CC (3 of 3)]
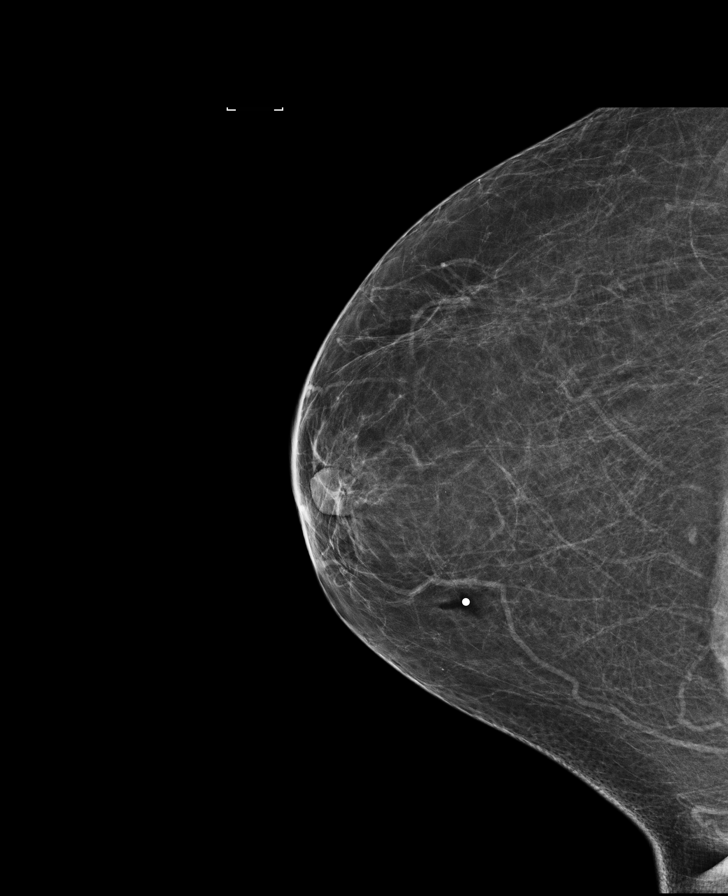

[L CC]
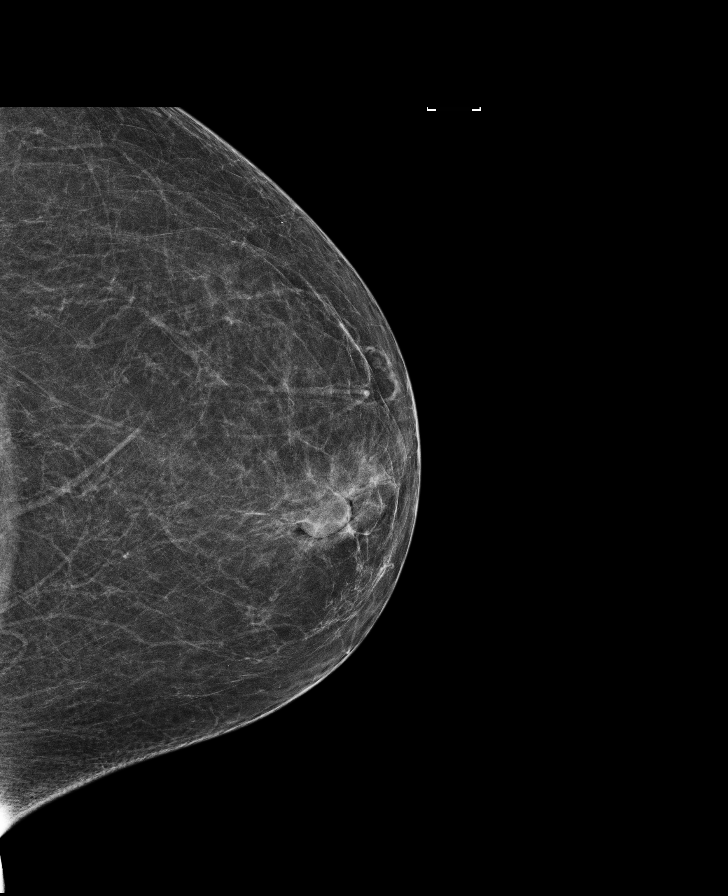

[L CC synth-2D]
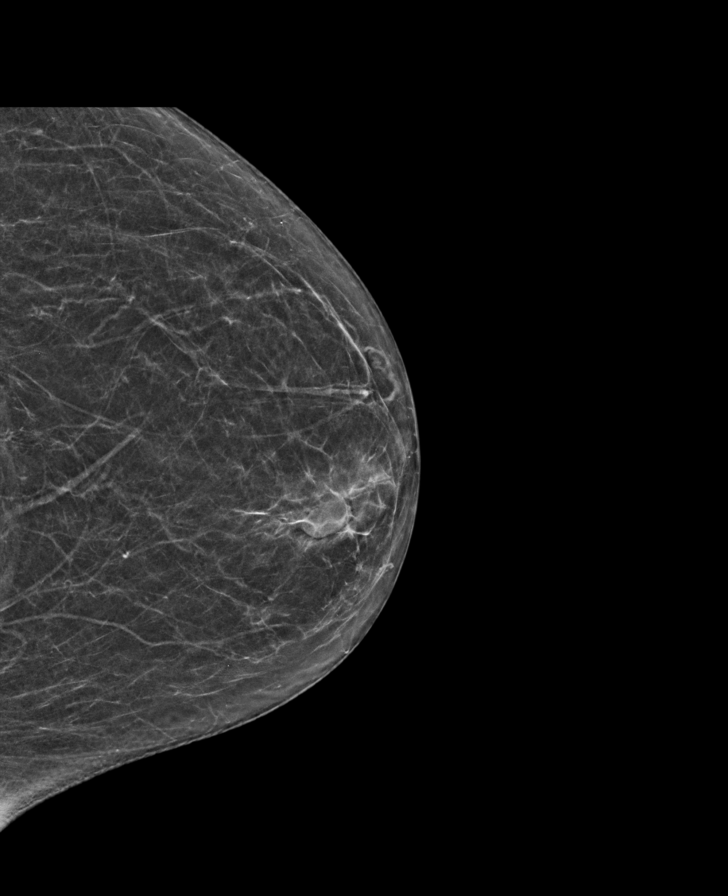

[8 of 40 positions shown; findings below may reference images not displayed]

FINDINGS: There is a 6 mm mass in the deep right breast on the CC view only
which may represent a tiny intramammary lymph node. This region was
never imaged previously. No other suspicious mammographic findings.

Mammographic images were processed with CAD.

On physical exam, no suspicious lumps are identified.

Targeted ultrasound is performed, showing no abnormalities in the
region of the palpable lump on the right. A sonographic correlate to
the posterior right breast mass was not identified.
IMPRESSION: Probably benign right breast mass, never included on previous
imaging. I suspect this may represent an intramammary lymph node. No
other suspicious findings.

RECOMMENDATION:
Recommend six-month follow-up of the probably benign right breast
mass. Treatment of the palpable lump should be based on clinical and
physical exam given lack of imaging findings. The patient should
consider genetic testing given her family history. If she is not
tested, recommendations would be for annual breast MRI and annual
mammography.

I have discussed the findings and recommendations with the patient.
Results were also provided in writing at the conclusion of the
visit. If applicable, a reminder letter will be sent to the patient
regarding the next appointment.

BI-RADS CATEGORY  3: Probably benign.

## 2019-01-04 ENCOUNTER — Ambulatory Visit (INDEPENDENT_AMBULATORY_CARE_PROVIDER_SITE_OTHER): Payer: Medicaid Other | Admitting: Podiatry

## 2019-01-04 ENCOUNTER — Other Ambulatory Visit: Payer: Self-pay

## 2019-01-04 ENCOUNTER — Encounter: Payer: Self-pay | Admitting: Podiatry

## 2019-01-04 DIAGNOSIS — M79675 Pain in left toe(s): Secondary | ICD-10-CM

## 2019-01-04 DIAGNOSIS — E1149 Type 2 diabetes mellitus with other diabetic neurological complication: Secondary | ICD-10-CM

## 2019-01-04 DIAGNOSIS — B351 Tinea unguium: Secondary | ICD-10-CM | POA: Diagnosis not present

## 2019-01-04 DIAGNOSIS — Q828 Other specified congenital malformations of skin: Secondary | ICD-10-CM

## 2019-01-04 NOTE — Progress Notes (Signed)
Complaint:  Visit Type: Patient returns to my office for continued preventative foot care services. Complaint: Patient states her nail big toe left foot is ingrown and thick.  Patient also has callus under her right forefoot. Patient has been diagnosed with DM with no foot complications. The patient presents for preventative foot care services.  Podiatric Exam: Vascular: dorsalis pedis and posterior tibial pulses are palpable bilateral. Capillary return is immediate. Temperature gradient is WNL. Skin turgor WNL  Sensorium: Absent/diminished  Semmes Weinstein monofilament test. Absent/diminished  tactile sensation bilaterally. Nail Exam: Pt has thick disfigured discolored nails with subungual debris noted  Hallux left foot. Ulcer Exam: There is no evidence of ulcer or pre-ulcerative changes or infection. Orthopedic Exam: Muscle tone and strength are WNL. No limitations in general ROM. No crepitus or effusions noted. Foot type and digits show no abnormalities. Hallux interphalangeus left greater than right. Skin:  Porokeratosis sub 5th met right foot.  Pinch callus  B/L with callus medial medial nail groove.  . No infection or ulcers  Diagnosis:  Onychomycosis left hallux.  Porokeratosis sub 5th right.  Treatment & Plan Procedures and Treatment: Consent by patient was obtained for treatment procedures.   Debridement of mycotic and hypertrophic toenails, 1 through 5 bilateral and clearing of subungual debris. No ulceration, no infection noted.  Return Visit-Office Procedure: Patient instructed to return to the office for a follow up visit 3 months for continued evaluation and treatment.    Gardiner Barefoot DPM

## 2019-04-05 ENCOUNTER — Ambulatory Visit: Payer: Medicaid Other | Admitting: Podiatry

## 2019-04-12 ENCOUNTER — Ambulatory Visit (INDEPENDENT_AMBULATORY_CARE_PROVIDER_SITE_OTHER): Payer: Medicaid Other | Admitting: Podiatry

## 2019-04-12 ENCOUNTER — Other Ambulatory Visit: Payer: Self-pay

## 2019-04-12 ENCOUNTER — Encounter: Payer: Self-pay | Admitting: Podiatry

## 2019-04-12 DIAGNOSIS — B351 Tinea unguium: Secondary | ICD-10-CM | POA: Diagnosis not present

## 2019-04-12 DIAGNOSIS — M129 Arthropathy, unspecified: Secondary | ICD-10-CM

## 2019-04-12 DIAGNOSIS — M79675 Pain in left toe(s): Secondary | ICD-10-CM

## 2019-04-12 DIAGNOSIS — M216X1 Other acquired deformities of right foot: Secondary | ICD-10-CM | POA: Diagnosis not present

## 2019-04-12 DIAGNOSIS — M79674 Pain in right toe(s): Secondary | ICD-10-CM

## 2019-04-12 NOTE — Progress Notes (Signed)
Complaint:  Visit Type: Patient returns to my office for continued preventative foot care services. Complaint: Patient states her nails are long thick and painful. Patient has been diagnosed with DM with no complications.  Her nails are painful walking and wearing her shoes.  She presents for preventative foot care services. She also has pain on the top of her left midffot/rearfoot .  This is painful to the touch.  She presents for evaluation and treatment of her left midfoot also.  Podiatric Exam: Vascular: dorsalis pedis and posterior tibial pulses are palpable bilateral. Capillary return is immediate. Temperature gradient is WNL. Skin turgor WNL  Sensorium: Absent/diminished  Semmes Weinstein monofilament test. Absent/diminished  tactile sensation bilaterally. Nail Exam: Pt has thick disfigured discolored nails with subungual debris noted nails  B/L. Ulcer Exam: There is no evidence of ulcer or pre-ulcerative changes or infection. Orthopedic Exam: Muscle tone and strength are WNL. No limitations in general ROM. No crepitus or effusions noted. Foot type and digits show no abnormalities. Hallux interphalangeus left greater than right. Dorsal exostosis left midfoot/rearfoot. Skin:  Porokeratosis sub 5th met right foot.  Pinch callus  B/L with callus medial medial nail groove.  . No infection or ulcers  Diagnosis:  Onychomycosis B/L. Arthritis sub acute left foot.  Treatment & Plan Procedures and Treatment: Consent by patient was obtained for treatment procedures.   Debridement of mycotic and hypertrophic toenails, 1 through 5 bilateral and clearing of subungual debris. No ulceration, no infection noted. Injection therapy left midfoot/rearfoot. .  Injection therapy using 1.0 cc. Of 2% xylocaine( 20 mg.) plus 1 cc. of kenalog-la ( 10 mg) plus 1/2 cc. of dexamethazone phosphate ( 2 mg).  RTC surgical consult.   with Dr.  Posey Pronto. Return Visit-Office Procedure: Patient instructed to return to the office  for a follow up prn.I    Gardiner Barefoot DPM

## 2019-04-22 ENCOUNTER — Ambulatory Visit: Payer: Medicaid Other | Admitting: Podiatry

## 2019-05-04 ENCOUNTER — Ambulatory Visit (INDEPENDENT_AMBULATORY_CARE_PROVIDER_SITE_OTHER): Payer: Medicaid Other

## 2019-05-04 ENCOUNTER — Ambulatory Visit (INDEPENDENT_AMBULATORY_CARE_PROVIDER_SITE_OTHER): Payer: Medicaid Other | Admitting: Podiatry

## 2019-05-04 ENCOUNTER — Other Ambulatory Visit: Payer: Self-pay

## 2019-05-04 DIAGNOSIS — M129 Arthropathy, unspecified: Secondary | ICD-10-CM

## 2019-05-04 DIAGNOSIS — B351 Tinea unguium: Secondary | ICD-10-CM | POA: Diagnosis not present

## 2019-05-04 DIAGNOSIS — M216X2 Other acquired deformities of left foot: Secondary | ICD-10-CM

## 2019-05-05 ENCOUNTER — Encounter: Payer: Self-pay | Admitting: Podiatry

## 2019-05-05 LAB — HEPATIC FUNCTION PANEL
ALT: 46 IU/L — ABNORMAL HIGH (ref 0–32)
AST: 34 IU/L (ref 0–40)
Albumin: 4.1 g/dL (ref 3.8–4.9)
Alkaline Phosphatase: 101 IU/L (ref 39–117)
Bilirubin Total: 0.4 mg/dL (ref 0.0–1.2)
Bilirubin, Direct: 0.11 mg/dL (ref 0.00–0.40)
Total Protein: 6.7 g/dL (ref 6.0–8.5)

## 2019-05-05 NOTE — Progress Notes (Signed)
Subjective:  Patient ID: Angela Hubbard, female    DOB: May 02, 1965,  MRN: [id number]  Chief Complaint  Patient presents with  . Foot Pain    pt is here for left plantar forefoot pain, as well as left dorsal foot pain.    54 y.o. female presents with the above complaint.  Patient presents with complaints of bilateral onychomycosis of fungus all the toes 1 through 5.  Patient states this has been going for years has progressively gotten worse.  She would like to discuss toenail fungus treatment as well.  She states there is some pain associated with it.  She denies any other acute treatment.  She has not tried anything over-the-counter as well.  She has not seen anyone else for this.  The pain is dull achy in nature.  Patient is a diabetic with last A1c unknown.   Review of Systems: Negative except as noted in the HPI. Denies N/V/F/Ch.  Past Medical History:  Diagnosis Date  . Anemia   . Autoimmune disorder ([place])   . Diabetes mellitus without complication ([place])   . Family history of BRCA2 gene positive   . Family history of breast cancer   . Hyperlipidemia   . Hypertension   . Morbid obesity ([place])   . Neuropathy   . Peripheral edema    chronic  . Type 1 diabetes ([place])   . Ulcerative colitis ([place])     Current Outpatient Medications:  .  Accu-Chek FastClix Lancets MISC, U TO MONITOR BG TID, Disp: , Rfl:  .  albuterol (PROVENTIL HFA;VENTOLIN HFA) 108 (90 Base) MCG/ACT inhaler, Inhale into the lungs., Disp: , Rfl:  .  amitriptyline (ELAVIL) 50 MG tablet, Take by mouth., Disp: , Rfl:  .  atorvastatin (LIPITOR) 40 MG tablet, Take 40 mg by mouth daily., Disp: , Rfl:  .  BD PEN NEEDLE NANO U/F 32G X 4 MM MISC, U BID, Disp: , Rfl:  .  Blood Glucose Monitoring Suppl (ACCU-CHEK GUIDE ME) w/Device KIT, by Does not apply route., Disp: , Rfl:  .  clobetasol (TEMOVATE) 0.05 % external solution, APP EXT AA WITH LESIONS BID, Disp: , Rfl:  .  fluticasone (FLONASE) 50 MCG/ACT nasal spray, , Disp:  , Rfl:  .  glucose blood (ACCU-CHEK AVIVA PLUS) test strip, U 1 STRIP TID, Disp: , Rfl:  .  HYDROcodone-acetaminophen (NORCO/VICODIN) 5-325 MG tablet, TK 1 T PO Q 8 H PRF SEVERE PAIN, Disp: , Rfl:  .  LANTUS SOLOSTAR 100 UNIT/ML Solostar Pen, INJ 30 UNI [place] HS. DISCONTINUE HUMALOG, Disp: , Rfl:  .  levothyroxine (SYNTHROID, LEVOTHROID) 75 MCG tablet, TK 1 T PO QD IN THE MORNING OES, Disp: , Rfl:  .  liraglutide (VICTOZA) 18 MG/3ML SOPN, INJECT 1.8MG UNDER THE SKIN DAILY, Disp: , Rfl:  .  metFORMIN (GLUCOPHAGE) 1000 MG tablet, Take by mouth., Disp: , Rfl:  .  Multiple Vitamin (MULTI-VITAMIN) tablet, Take by mouth., Disp: , Rfl:  .  Multiple Vitamins-Iron TABS, Take by mouth., Disp: , Rfl:  .  Multiple Vitamins-Minerals (MULTIVITAMIN ADULT PO), Take by mouth., Disp: , Rfl:  .  nystatin (MYCOSTATIN/NYSTOP) powder, APP EXT AA BID, Disp: , Rfl:  .  omeprazole (PRILOSEC) 40 MG capsule, Take 40 mg by mouth daily., Disp: , Rfl:  .  pregabalin (LYRICA) 150 MG capsule, Take by mouth., Disp: , Rfl:  .  cephALEXin (KEFLEX) 500 MG capsule, TAKE 1 CAPSULE(500 MG) BY MOUTH TWICE DAILY, Disp: 6 capsule, Rfl: 0 .  cephALEXin (KEFLEX)  500 MG capsule, Take 1 capsule (500 mg total) by mouth 2 (two) times daily., Disp: 6 capsule, Rfl: 0 .  cyclobenzaprine (FLEXERIL) 10 MG tablet, , Disp: , Rfl:  .  gemfibrozil (LOPID) 600 MG tablet, Take 600 mg by mouth 2 (two) times daily before a meal., Disp: , Rfl:  .  neomycin-polymyxin-hydrocortisone (CORTISPORIN) OTIC solution, APPLY 1-2 DROPS TO TOE BID AFTER SOAKING, Disp: , Rfl:   Social History   Tobacco Use  Smoking Status Current Every Day Smoker  . Packs/day: 0.50  . Years: 30.00  . Pack years: 15.00  . Types: Cigarettes  Smokeless Tobacco Never Used    No Known Allergies Objective:  There were no vitals filed for this visit. There is no height or weight on file to calculate BMI. Constitutional Well developed. Well nourished.  Vascular Dorsalis pedis  pulses palpable bilaterally. Posterior tibial pulses palpable bilaterally. Capillary refill normal to all digits.  No cyanosis or clubbing noted. Pedal hair growth normal.  Neurologic Normal speech. Oriented to person, place, and time. Epicritic sensation to light touch grossly present bilaterally.  Dermatologic  thickened elongated mycotic toenails x10 bilaterally.  Mild pain on palpation. No open wounds. No skin lesions.  Orthopedic:  Manual muscle testing 5 out of 5.  No musculoskeletal pain noted.   Radiographs: 3 views of skeletally mature adult left foot: Posterior heel spurring noted as well as midfoot arthritis noted.  Hallux abductor's of the first digit noted as well.  No other bony abnormalities identified.  Assessment:   1. Subacute arthropathy   2. Nail fungus    Plan:  Patient was evaluated and treated and all questions answered.  Bilateral onychomycosis digit 1 through 5 -Educated the patient on the etiology of onychomycosis and various treatment options associated with improving the fungal load.  I explained to the patient that there is 3 treatment options available to treat the onychomycosis including topical, p.o., laser treatment.  Patient elected to undergo p.o. options with Lamisil/terbinafine therapy.  In order for me to start the medication therapy, I explained to the patient the importance of evaluating the liver and obtaining the liver function test.  Once the liver function test comes back normal I will start him on 47-monthcourse of Lamisil therapy.  Patient understood all risk and would like to proceed with Lamisil therapy.  I have asked the patient to immediately stop the Lamisil therapy if she has any reactions to it and call the office or go to the emergency room right away.  Patient states understanding   No follow-ups on file.

## 2019-07-02 IMAGING — MG DIGITAL DIAGNOSTIC UNILATERAL RIGHT MAMMOGRAM WITH TOMO AND CAD
2 series · 2 of 6 positions shown · non-contrast
Comparison: Previous exams including most recent diagnostic
mammogram and ultrasound dated 11/13/2016.

ACR Breast Density Category a: The breast tissue is almost entirely
fatty.

CLINICAL DATA: Patient returns today to evaluate a probably benign
mass within the RIGHT breast, suspected intramammary lymph node.

EXAM:
DIGITAL DIAGNOSTIC UNILATERAL RIGHT MAMMOGRAM WITH CAD AND TOMO

[R CC synth-2D]
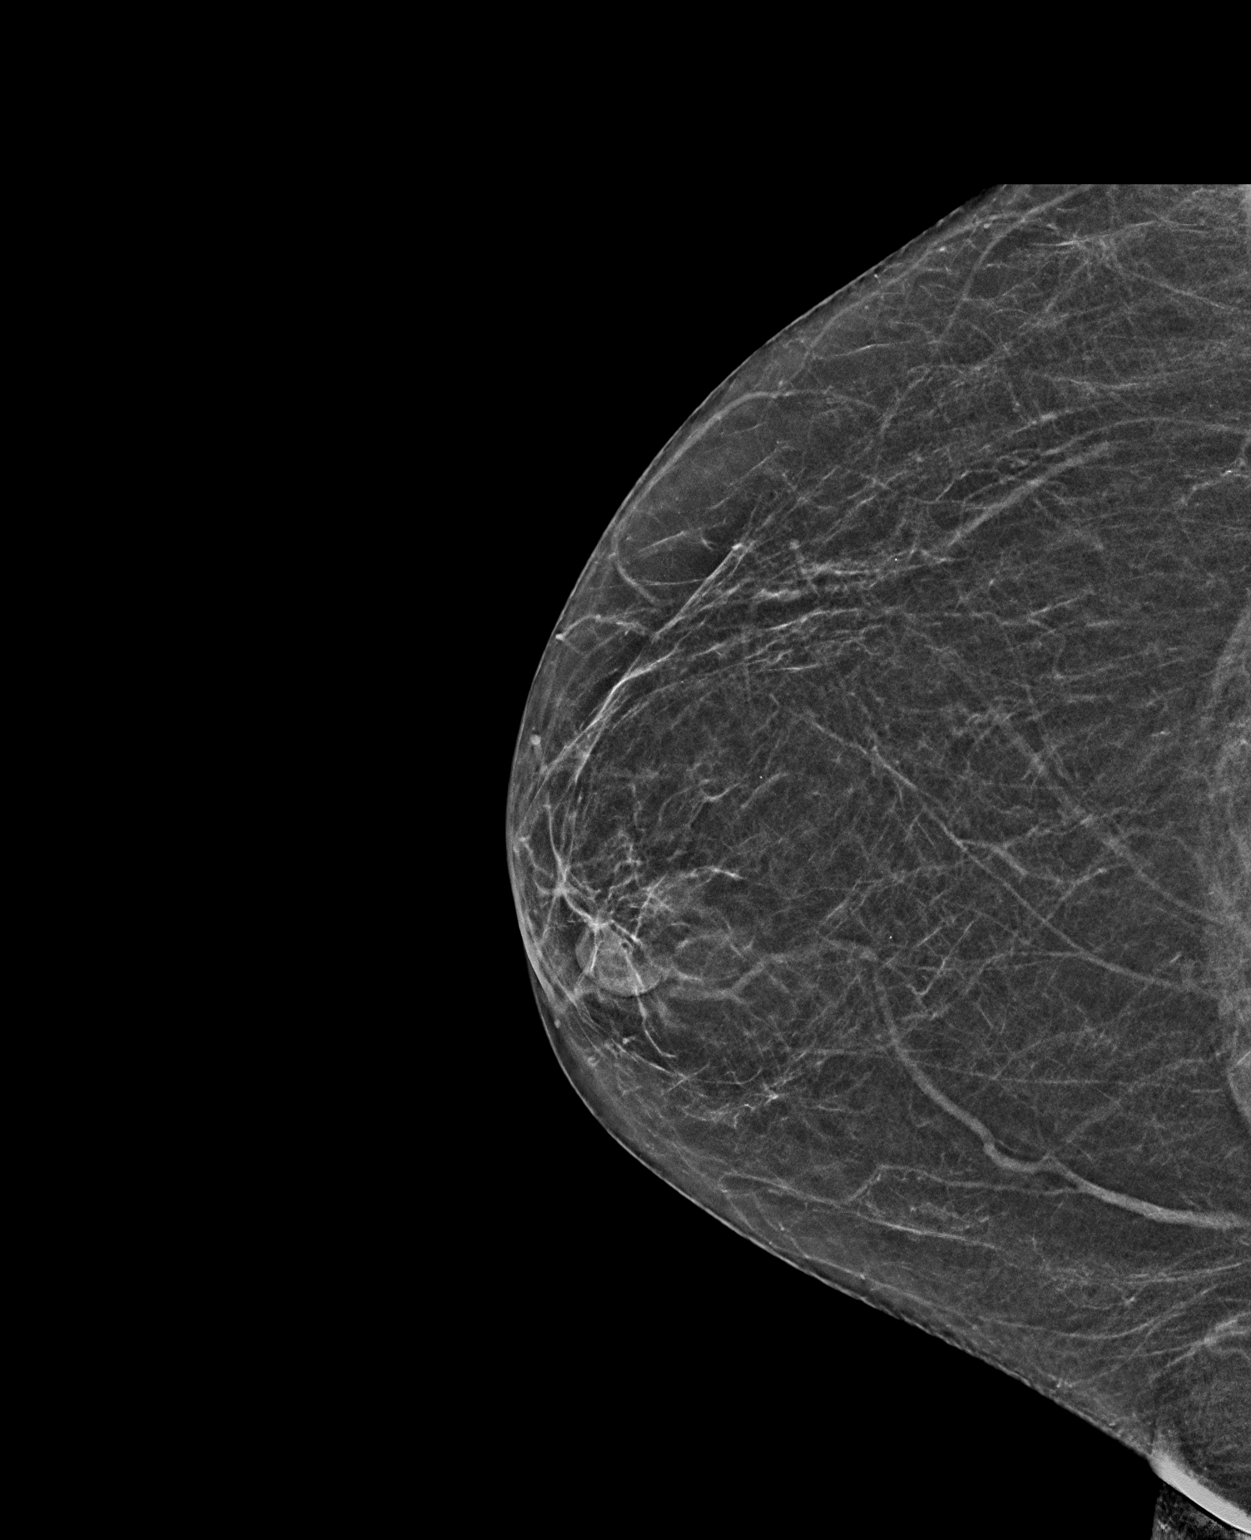

[R CC tomo · tomo slice 25/48.0]
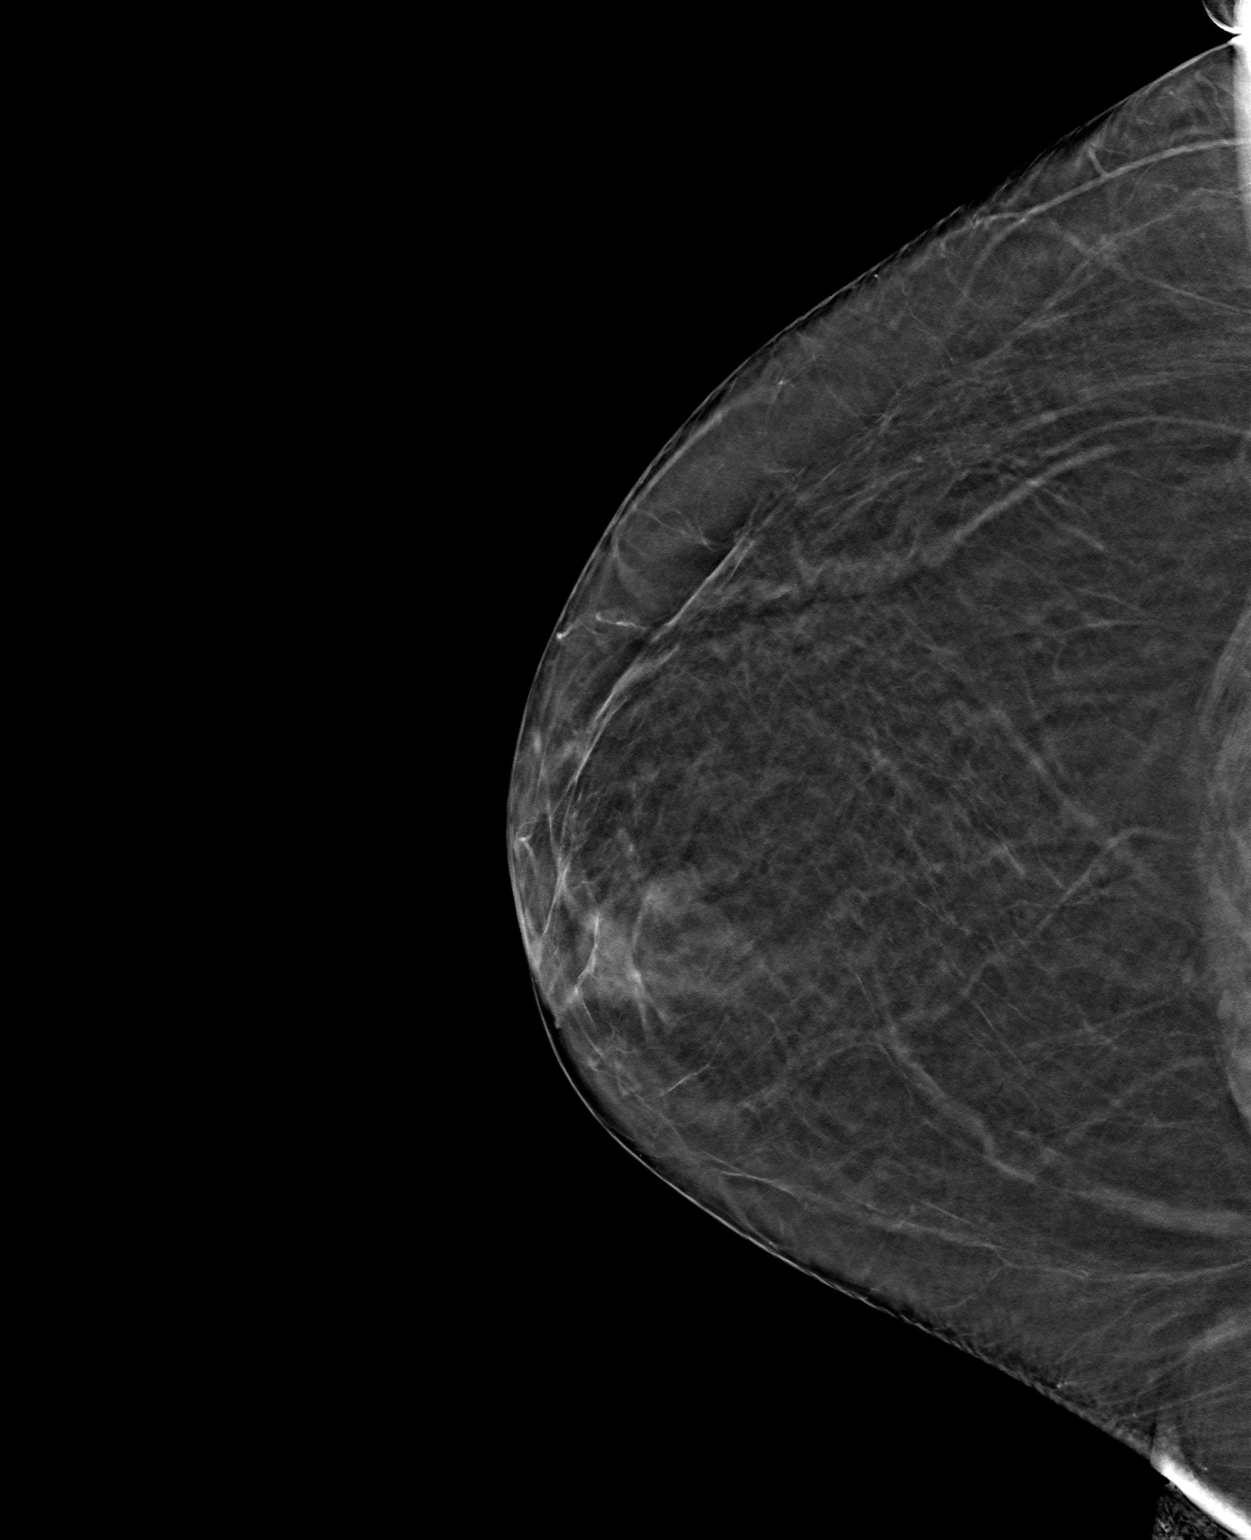

[2 of 6 positions shown; findings below may reference images not displayed]

FINDINGS: Again noted is a 6 mm mass in the posterior RIGHT breast, CC view
only, with central lucency compatible with the fatty hilum of a
benign intramammary lymph node. No new dominant masses, suspicious
calcifications or secondary signs of malignancy are identified in
the RIGHT breast on today's exam.

Mammographic images were processed with CAD.
IMPRESSION: No evidence of malignancy. Benign intramammary lymph node within the
posterior RIGHT breast.

Patient may return to routine annual bilateral screening mammogram
schedule. Next bilateral screening mammogram will be due in 6 months
corresponding to patient's routine LEFT breast screening mammogram
schedule.

RECOMMENDATION:
Bilateral screening mammogram in 6 months.

I have discussed the findings and recommendations with the patient.
Results were also provided in writing at the conclusion of the
visit. If applicable, a reminder letter will be sent to the patient
regarding the next appointment.

BI-RADS CATEGORY  2: Benign.

## 2019-07-06 ENCOUNTER — Ambulatory Visit (INDEPENDENT_AMBULATORY_CARE_PROVIDER_SITE_OTHER): Payer: Medicaid Other | Admitting: Clinical

## 2019-07-06 ENCOUNTER — Other Ambulatory Visit: Payer: Self-pay

## 2019-07-06 DIAGNOSIS — F411 Generalized anxiety disorder: Secondary | ICD-10-CM

## 2019-07-08 DIAGNOSIS — F411 Generalized anxiety disorder: Secondary | ICD-10-CM | POA: Insufficient documentation
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
## 2019-07-20 ENCOUNTER — Ambulatory Visit (INDEPENDENT_AMBULATORY_CARE_PROVIDER_SITE_OTHER): Payer: Medicaid Other | Admitting: Clinical

## 2019-07-20 ENCOUNTER — Other Ambulatory Visit: Payer: Self-pay

## 2019-07-20 DIAGNOSIS — F411 Generalized anxiety disorder: Secondary | ICD-10-CM | POA: Diagnosis not present

## 2019-07-20 NOTE — Progress Notes (Signed)
   THERAPIST PROGRESS NOTE  Session Time: 53 minutes  Participation Level: Active  Behavioral Response: CasualAlertEuthymic  Type of Therapy: Individual Therapy  Treatment Goals addressed: Anxiety  Interventions: CBT  Summary:  Angela Hubbard is a 54 y.o. female who presents for the scheduled session in person. Client presented oriented times five, friendly, and appropriately dressed. Client denied hallucinations and delusions.  Client reported today she is feeling much better over the past few weeks. Client reported in regards to her boyfriends mother she was able to place her in a long term facility so she will not be returning home to live with them. Client expressed a great sense of relief and comfort being at home without concern for safety. Client reported she also relieved herself of handling financial affairs and gave it to her boyfriends sister.  Client reported she foresees stressful situations to come with her boyfriends other siblings because she is the power of attorney but is enjoying the improvement of feeling less stressed presently.  Client reported excitement for her sons recent engagement and enjoying the support she has from all of her sons. Client reported with the therapist her other medical doctor encouraged her originally to seek out mental health assistance because her stress at home was affecting her health. Client reported now she is happier and in a better thinking space to better take care of herself. Client reported due to her improvement therapy is no longer needed at this time but will return in the future if additional support is needed.    Suicidal/Homicidal: Nowithout intent/plan  Therapist Response:   Therapist began the session by doing a mood check with the client. Therapist continued to build a therapeutic relationship with the client by using active listening, eye contact, and offering empathy as the client discussed her thoughts and feelings.   Therapist reinforced the clients positive remarks about the future and her positive changes in her behavior and emotions. Therapist reinforced the importance of boundaries and having good self care practices mentally and physically.      Plan: Discharge the client from therapy services at this time per clients request and agreement from the therapist.  Diagnosis: Generalized Anxiety Disorder     Neena Rhymes Rickelle Sylvestre, LCSW 07/20/2019

## 2019-09-07 ENCOUNTER — Encounter: Payer: Self-pay | Admitting: Podiatry

## 2019-09-07 ENCOUNTER — Other Ambulatory Visit: Payer: Self-pay

## 2019-09-07 ENCOUNTER — Ambulatory Visit: Payer: Medicaid Other | Admitting: Podiatry

## 2019-09-07 DIAGNOSIS — M7751 Other enthesopathy of right foot: Secondary | ICD-10-CM | POA: Diagnosis not present

## 2019-09-07 DIAGNOSIS — B351 Tinea unguium: Secondary | ICD-10-CM

## 2019-09-07 DIAGNOSIS — M778 Other enthesopathies, not elsewhere classified: Secondary | ICD-10-CM

## 2019-09-07 NOTE — Progress Notes (Addendum)
Subjective:  Patient ID: Angela Hubbard, female    DOB: 1965/10/12,  MRN: [id number]  Chief Complaint  Patient presents with  . Foot Pain    patient presents today for continued bilat foot pain  "the last injection helped some, but the pain has gotten bad"   She also wants to discuss nail fungus treatment    54 y.o. female presents with the above complaint.  Patient presents with complaints of bilateral onychomycosis of fungus all the toes 1 through 5.  Patient states this has been going for years has progressively gotten worse.  She would like to discuss toenail fungus treatment as well.  She states there is some pain associated with it.  She denies any other acute treatment.  She has not tried anything over-the-counter as well.  She has not seen anyone else for this.  The pain is dull achy in nature.  Patient is a diabetic with last A1c unknown.  Patient also has secondary complaint of bilateral lateral foot pain that has been on for quite some time.  The pain is right at the fifth metatarsal base.  Pain with dorsiflexion and eversion of the foot.  She states it has been going for a long time.  She states that she would like injection which has helped in the past.  She denies any other acute complaints.   Review of Systems: Negative except as noted in the HPI. Denies N/V/F/Ch.  Past Medical History:  Diagnosis Date  . Anemia   . Autoimmune disorder ([place])   . Diabetes mellitus without complication ([place])   . Family history of BRCA2 gene positive   . Family history of breast cancer   . Hyperlipidemia   . Hypertension   . Morbid obesity ([place])   . Neuropathy   . Peripheral edema    chronic  . Type 1 diabetes ([place])   . Ulcerative colitis ([place])     Current Outpatient Medications:  .  Accu-Chek FastClix Lancets MISC, U TO MONITOR BG TID, Disp: , Rfl:  .  albuterol (PROVENTIL HFA;VENTOLIN HFA) 108 (90 Base) MCG/ACT inhaler, Inhale into the lungs., Disp: , Rfl:  .  amitriptyline (ELAVIL)  50 MG tablet, Take by mouth., Disp: , Rfl:  .  atorvastatin (LIPITOR) 40 MG tablet, Take 40 mg by mouth daily., Disp: , Rfl:  .  BD PEN NEEDLE NANO U/F 32G X 4 MM MISC, U BID, Disp: , Rfl:  .  Blood Glucose Monitoring Suppl (ACCU-CHEK GUIDE ME) w/Device KIT, by Does not apply route., Disp: , Rfl:  .  cephALEXin (KEFLEX) 500 MG capsule, TAKE 1 CAPSULE(500 MG) BY MOUTH TWICE DAILY, Disp: 6 capsule, Rfl: 0 .  cephALEXin (KEFLEX) 500 MG capsule, Take 1 capsule (500 mg total) by mouth 2 (two) times daily., Disp: 6 capsule, Rfl: 0 .  clobetasol (TEMOVATE) 0.05 % external solution, APP EXT AA WITH LESIONS BID, Disp: , Rfl:  .  cyclobenzaprine (FLEXERIL) 10 MG tablet, , Disp: , Rfl:  .  fluticasone (FLONASE) 50 MCG/ACT nasal spray, , Disp: , Rfl:  .  gemfibrozil (LOPID) 600 MG tablet, Take 600 mg by mouth 2 (two) times daily before a meal., Disp: , Rfl:  .  glucose blood (ACCU-CHEK AVIVA PLUS) test strip, U 1 STRIP TID, Disp: , Rfl:  .  HYDROcodone-acetaminophen (NORCO/VICODIN) 5-325 MG tablet, TK 1 T PO Q 8 H PRF SEVERE PAIN, Disp: , Rfl:  .  LANTUS SOLOSTAR 100 UNIT/ML Solostar Pen, INJ 30 UNI [place] HS. DISCONTINUE  HUMALOG, Disp: , Rfl:  .  levothyroxine (SYNTHROID, LEVOTHROID) 75 MCG tablet, TK 1 T PO QD IN THE MORNING OES, Disp: , Rfl:  .  liraglutide (VICTOZA) 18 MG/3ML SOPN, INJECT 1.8MG UNDER THE SKIN DAILY, Disp: , Rfl:  .  metFORMIN (GLUCOPHAGE) 1000 MG tablet, Take by mouth., Disp: , Rfl:  .  Multiple Vitamin (MULTI-VITAMIN) tablet, Take by mouth., Disp: , Rfl:  .  Multiple Vitamins-Iron TABS, Take by mouth., Disp: , Rfl:  .  Multiple Vitamins-Minerals (MULTIVITAMIN ADULT PO), Take by mouth., Disp: , Rfl:  .  neomycin-polymyxin-hydrocortisone (CORTISPORIN) OTIC solution, APPLY 1-2 DROPS TO TOE BID AFTER SOAKING, Disp: , Rfl:  .  nystatin (MYCOSTATIN/NYSTOP) powder, APP EXT AA BID, Disp: , Rfl:  .  omeprazole (PRILOSEC) 40 MG capsule, Take 40 mg by mouth daily., Disp: , Rfl:  .  pregabalin  (LYRICA) 150 MG capsule, Take by mouth., Disp: , Rfl:   Social History   Tobacco Use  Smoking Status Current Every Day Smoker  . Packs/day: 0.50  . Years: 30.00  . Pack years: 15.00  . Types: Cigarettes  Smokeless Tobacco Never Used    No Known Allergies Objective:  There were no vitals filed for this visit. There is no height or weight on file to calculate BMI. Constitutional Well developed. Well nourished.  Vascular Dorsalis pedis pulses palpable bilaterally. Posterior tibial pulses palpable bilaterally. Capillary refill normal to all digits.  No cyanosis or clubbing noted. Pedal hair growth normal.  Neurologic Normal speech. Oriented to person, place, and time. Epicritic sensation to light touch grossly present bilaterally.  Dermatologic  thickened elongated mycotic toenails x10 bilaterally.  Mild pain on palpation. No open wounds. No skin lesions.  Orthopedic:  Manual muscle testing 5 out of 5.  Pain on palpation fifth metatarsal base bilaterally.  No pain along the course of the peroneal tendons.  Mild pain with dorsiflexion and eversion of the foot resisted.  These findings are consistent bilaterally.   Radiographs: 3 views of skeletally mature adult left foot: Posterior heel spurring noted as well as midfoot arthritis noted.  Hallux abductor's of the first digit noted as well.  No other bony abnormalities identified.  Assessment:   1. Nail fungus   2. Capsulitis of right foot   3. Capsulitis of left foot    Plan:  Patient was evaluated and treated and all questions answered.   Bilateral fifth metatarsal base capsulitis -I explained to the patient the etiology of capsulitis of her treatment options were discussed.  At this I believe patient is putting excessive stress on the outside part of the foot likely due to underlying high arch cavus foot structure.  I discussed this with patient in extensive detail.  Given the amount of pain that she is having I believe she  will benefit from a steroid injection at the point of maximal tenderness tenderness.  I discussed that there is a high risk of tendon rupture associated with this.  Patient states understanding to proceed despite the risks. -A steroid injection was performed at bilateral lateral foot a point of maximal tenderness using 1% plain Lidocaine and 10 mg of Kenalog. This was well tolerated.   Bilateral onychomycosis digit 1 through 5 -Educated the patient on the etiology of onychomycosis and various treatment options associated with improving the fungal load.  I explained to the patient that there is 3 treatment options available to treat the onychomycosis including topical, p.o., laser treatment.  Patient elected to undergo p.o. options  with Lamisil/terbinafine therapy.  In order for me to start the medication therapy, I explained to the patient the importance of evaluating the liver and obtaining the liver function test.  Once the liver function test comes back normal I will start him on 3-monthcourse of Lamisil therapy.  Patient understood all risk and would like to proceed with Lamisil therapy.  I have asked the patient to immediately stop the Lamisil therapy if she has any reactions to it and call the office or go to the emergency room right away.  Patient states understanding and will do 30 days at a time prior to obtaining another liver function test to make sure that her liver is doing okay given that she had 1 incident of positive LFTs.   No follow-ups on file.

## 2019-09-08 LAB — HEPATIC FUNCTION PANEL
ALT: 17 IU/L (ref 0–32)
AST: 20 IU/L (ref 0–40)
Albumin: 3.8 g/dL (ref 3.8–4.9)
Alkaline Phosphatase: 97 IU/L (ref 48–121)
Bilirubin Total: 0.5 mg/dL (ref 0.0–1.2)
Bilirubin, Direct: 0.16 mg/dL (ref 0.00–0.40)
Total Protein: 6.3 g/dL (ref 6.0–8.5)

## 2019-09-08 LAB — SPECIMEN STATUS REPORT

## 2019-09-08 MED ORDER — TERBINAFINE HCL 250 MG PO TABS: 250.0000 mg | ORAL_TABLET | Freq: Every day | ORAL | 0 refills | Status: DC

## 2019-09-08 NOTE — Addendum Note (Signed)
Addended by: Nicholes Rough on: 09/08/2019 07:50 AM   Modules accepted: Orders

## 2019-10-08 ENCOUNTER — Encounter: Payer: Self-pay | Admitting: Podiatry

## 2019-10-15 ENCOUNTER — Other Ambulatory Visit: Payer: Self-pay | Admitting: Podiatry

## 2019-10-15 ENCOUNTER — Telehealth: Payer: Self-pay | Admitting: *Deleted

## 2019-10-15 NOTE — Telephone Encounter (Signed)
I am following up to see if you had been taken care of regarding your appointment.  "Yes, I canceled my appointment and told them I would call back to reschedule it."  Do you want to reschedule it now?  "No, I don't know when I'll be back in town.  However, I do want to keep the December 21 appointment.

## 2019-10-16 NOTE — Telephone Encounter (Signed)
Please Advise

## 2019-10-19 ENCOUNTER — Ambulatory Visit: Payer: Medicaid Other | Admitting: Podiatry

## 2019-11-17 ENCOUNTER — Other Ambulatory Visit: Payer: Self-pay | Admitting: Podiatry

## 2019-11-17 NOTE — Telephone Encounter (Signed)
Please advise 

## 2020-01-11 ENCOUNTER — Ambulatory Visit: Payer: Medicaid Other | Admitting: Podiatry

## 2020-01-18 ENCOUNTER — Ambulatory Visit: Payer: Medicaid Other | Admitting: Podiatry

## 2020-01-27 ENCOUNTER — Other Ambulatory Visit: Payer: Self-pay

## 2020-01-27 ENCOUNTER — Ambulatory Visit (INDEPENDENT_AMBULATORY_CARE_PROVIDER_SITE_OTHER): Payer: Medicaid Other | Admitting: Podiatry

## 2020-01-27 ENCOUNTER — Encounter: Payer: Self-pay | Admitting: Podiatry

## 2020-01-27 DIAGNOSIS — B351 Tinea unguium: Secondary | ICD-10-CM

## 2020-01-27 MED ORDER — TERBINAFINE HCL 250 MG PO TABS: 250.0000 mg | ORAL_TABLET | Freq: Every day | ORAL | 1 refills | Status: AC

## 2020-01-28 ENCOUNTER — Encounter: Payer: Self-pay | Admitting: Podiatry

## 2020-01-28 NOTE — Progress Notes (Signed)
Subjective:  Patient ID: Angela Hubbard, female    DOB: 1965/07/04,  MRN: [id number]  Chief Complaint  Patient presents with  . Nail Problem    "I cant tell any improvement in my nails and I only took two months.  I could not get the last refill filled."    55 y.o. female presents with the above complaint.  Patient presents with follow-up of bilateral onychomycosis/nail fungus.  Patient states the Lamisil therapy has been helping and is improving.  She has completed 30-day course.  She would like to know if she should do another 30-day course.  She denies any other acute complaints.   Review of Systems: Negative except as noted in the HPI. Denies N/V/F/Ch.  Past Medical History:  Diagnosis Date  . Anemia   . Autoimmune disorder ([place])   . Diabetes mellitus without complication ([place])   . Family history of BRCA2 gene positive   . Family history of breast cancer   . Hyperlipidemia   . Hypertension   . Morbid obesity ([place])   . Neuropathy   . Peripheral edema    chronic  . Type 1 diabetes ([place])   . Ulcerative colitis ([place])     Current Outpatient Medications:  .  terbinafine (LAMISIL) 250 MG tablet, Take 1 tablet (250 mg total) by mouth daily., Disp: 30 tablet, Rfl: 1 .  Accu-Chek FastClix Lancets MISC, U TO MONITOR BG TID, Disp: , Rfl:  .  albuterol (PROVENTIL HFA;VENTOLIN HFA) 108 (90 Base) MCG/ACT inhaler, Inhale into the lungs., Disp: , Rfl:  .  amitriptyline (ELAVIL) 50 MG tablet, Take by mouth., Disp: , Rfl:  .  atorvastatin (LIPITOR) 40 MG tablet, Take 40 mg by mouth daily., Disp: , Rfl:  .  BD PEN NEEDLE NANO U/F 32G X 4 MM MISC, U BID, Disp: , Rfl:  .  Blood Glucose Monitoring Suppl (ACCU-CHEK GUIDE ME) w/Device KIT, by Does not apply route., Disp: , Rfl:  .  cephALEXin (KEFLEX) 500 MG capsule, TAKE 1 CAPSULE(500 MG) BY MOUTH TWICE DAILY, Disp: 6 capsule, Rfl: 0 .  cephALEXin (KEFLEX) 500 MG capsule, Take 1 capsule (500 mg total) by mouth 2 (two) times daily., Disp: 6  capsule, Rfl: 0 .  clobetasol (TEMOVATE) 0.05 % external solution, APP EXT AA WITH LESIONS BID, Disp: , Rfl:  .  cyclobenzaprine (FLEXERIL) 10 MG tablet, , Disp: , Rfl:  .  fluticasone (FLONASE) 50 MCG/ACT nasal spray, , Disp: , Rfl:  .  gemfibrozil (LOPID) 600 MG tablet, Take 600 mg by mouth 2 (two) times daily before a meal., Disp: , Rfl:  .  glucose blood (ACCU-CHEK AVIVA PLUS) test strip, U 1 STRIP TID, Disp: , Rfl:  .  HYDROcodone-acetaminophen (NORCO/VICODIN) 5-325 MG tablet, TK 1 T PO Q 8 H PRF SEVERE PAIN, Disp: , Rfl:  .  LANTUS SOLOSTAR 100 UNIT/ML Solostar Pen, INJ 30 UNI [place] HS. DISCONTINUE HUMALOG, Disp: , Rfl:  .  levothyroxine (SYNTHROID, LEVOTHROID) 75 MCG tablet, TK 1 T PO QD IN THE MORNING OES, Disp: , Rfl:  .  liraglutide (VICTOZA) 18 MG/3ML SOPN, INJECT 1.8MG UNDER THE SKIN DAILY, Disp: , Rfl:  .  metFORMIN (GLUCOPHAGE) 1000 MG tablet, Take by mouth., Disp: , Rfl:  .  Multiple Vitamin (MULTI-VITAMIN) tablet, Take by mouth., Disp: , Rfl:  .  Multiple Vitamins-Iron TABS, Take by mouth., Disp: , Rfl:  .  Multiple Vitamins-Minerals (MULTIVITAMIN ADULT PO), Take by mouth., Disp: , Rfl:  .  neomycin-polymyxin-hydrocortisone (CORTISPORIN) OTIC  solution, APPLY 1-2 DROPS TO TOE BID AFTER SOAKING, Disp: , Rfl:  .  nystatin (MYCOSTATIN/NYSTOP) powder, APP EXT AA BID, Disp: , Rfl:  .  omeprazole (PRILOSEC) 40 MG capsule, Take 40 mg by mouth daily., Disp: , Rfl:  .  pregabalin (LYRICA) 150 MG capsule, Take by mouth., Disp: , Rfl:  .  terbinafine (LAMISIL) 250 MG tablet, TAKE 1 TABLET(250 MG) BY MOUTH DAILY, Disp: 30 tablet, Rfl: 0  Social History   Tobacco Use  Smoking Status Current Every Day Smoker  . Packs/day: 0.50  . Years: 30.00  . Pack years: 15.00  . Types: Cigarettes  Smokeless Tobacco Never Used    No Known Allergies Objective:  There were no vitals filed for this visit. There is no height or weight on file to calculate BMI. Constitutional Well developed. Well  nourished.  Vascular Dorsalis pedis pulses palpable bilaterally. Posterior tibial pulses palpable bilaterally. Capillary refill normal to all digits.  No cyanosis or clubbing noted. Pedal hair growth normal.  Neurologic Normal speech. Oriented to person, place, and time. Epicritic sensation to light touch grossly present bilaterally.  Dermatologic  thickened elongated mycotic toenails x10 bilaterally which are improving.  Mild pain on palpation. No open wounds. No skin lesions.  Orthopedic:  Manual muscle testing 5 out of 5.  Pain on palpation fifth metatarsal base bilaterally.  No pain along the course of the peroneal tendons.  Mild pain with dorsiflexion and eversion of the foot resisted.  These findings are consistent bilaterally.   Radiographs: 3 views of skeletally mature adult left foot: Posterior heel spurring noted as well as midfoot arthritis noted.  Hallux abductor's of the first digit noted as well.  No other bony abnormalities identified.  Assessment:   1. Nail fungus   2. Onychomycosis due to dermatophyte    Plan:  Patient was evaluated and treated and all questions answered.   Bilateral fifth metatarsal base capsulitis -I explained to the patient the etiology of capsulitis of her treatment options were discussed.  At this I believe patient is putting excessive stress on the outside part of the foot likely due to underlying high arch cavus foot structure.  I discussed this with patient in extensive detail.  Given the amount of pain that she is having I believe she will benefit from a steroid injection at the point of maximal tenderness tenderness.  I discussed that there is a high risk of tendon rupture associated with this.  Patient states understanding to proceed despite the risks. -A steroid injection was performed at bilateral lateral foot a point of maximal tenderness using 1% plain Lidocaine and 10 mg of Kenalog. This was well tolerated.   Bilateral onychomycosis  digit 1 through 5 -Educated the patient on the etiology of onychomycosis and various treatment options associated with improving the fungal load.  I explained to the patient that there is 3 treatment options available to treat the onychomycosis including topical, p.o., laser treatment.  Patient denied to have any negative reaction to Lamisil therapy for taking it for 1 month.  I will continue another 30-day supply to complete the course.  Patient states understanding overall the nails have improved considerably.   No follow-ups on file.

## 2020-04-27 ENCOUNTER — Ambulatory Visit: Payer: Medicaid Other | Admitting: Podiatry

## 2020-05-23 ENCOUNTER — Other Ambulatory Visit: Payer: Self-pay

## 2020-05-23 ENCOUNTER — Ambulatory Visit (INDEPENDENT_AMBULATORY_CARE_PROVIDER_SITE_OTHER): Payer: Medicaid Other | Admitting: Podiatry

## 2020-05-23 ENCOUNTER — Encounter: Payer: Self-pay | Admitting: Podiatry

## 2020-05-23 DIAGNOSIS — B351 Tinea unguium: Secondary | ICD-10-CM | POA: Diagnosis not present

## 2020-05-23 NOTE — Progress Notes (Signed)
  Subjective:  Patient ID: Angela Hubbard, female    DOB: 09-09-1965,  MRN: [id number]  Chief Complaint  Patient presents with  . Nail Problem  . Foot Pain    Nail and callous trim, [facility]   She is c/o pain bottom of left foot and base of 5th met   55 y.o. female returns for the above complaint.  Patient presents with thickened elongated dystrophic toenails x10.  Patient states painful to touch.  She would like to have them debrided down.  She would like to have them debrided and she is not able to do it himself.  Objective:  There were no vitals filed for this visit. Podiatric Exam: Vascular: dorsalis pedis and posterior tibial pulses are palpable bilateral. Capillary return is immediate. Temperature gradient is WNL. Skin turgor WNL  Sensorium: Normal Semmes Weinstein monofilament test. Normal tactile sensation bilaterally. Nail Exam: Pt has thick disfigured discolored nails with subungual debris noted bilateral entire nail hallux through fifth toenails.  Pain on palpation to the nails. Ulcer Exam: There is no evidence of ulcer or pre-ulcerative changes or infection. Orthopedic Exam: Muscle tone and strength are WNL. No limitations in general ROM. No crepitus or effusions noted. HAV  B/L.  Hammer toes 2-5  B/L. Skin: No Porokeratosis. No infection or ulcers    Assessment & Plan:   1. Onychomycosis due to dermatophyte   2. Nail fungus     Patient was evaluated and treated and all questions answered.  Onychomycosis with pain  -Nails palliatively debrided as below. -Educated on self-care  Procedure: Nail Debridement Rationale: pain  Type of Debridement: manual, sharp debridement. Instrumentation: Nail nipper, rotary burr. Number of Nails: 10  Procedures and Treatment: Consent by patient was obtained for treatment procedures. The patient understood the discussion of treatment and procedures well. All questions were answered thoroughly reviewed. Debridement of mycotic and  hypertrophic toenails, 1 through 5 bilateral and clearing of subungual debris. No ulceration, no infection noted.  Return Visit-Office Procedure: Patient instructed to return to the office for a follow up visit 3 months for continued evaluation and treatment.  Angela Hubbard, DPM    No follow-ups on file.

## 2020-05-25 ENCOUNTER — Ambulatory Visit: Payer: Medicaid Other | Admitting: Podiatry

## 2022-10-17 ENCOUNTER — Ambulatory Visit: Payer: Medicaid Other | Admitting: Podiatry

## 2022-10-17 ENCOUNTER — Encounter: Payer: Self-pay | Admitting: Podiatry

## 2022-10-17 DIAGNOSIS — L84 Corns and callosities: Secondary | ICD-10-CM

## 2022-10-17 DIAGNOSIS — E1149 Type 2 diabetes mellitus with other diabetic neurological complication: Secondary | ICD-10-CM

## 2022-10-17 NOTE — Progress Notes (Signed)
Subjective:  Patient ID: Angela Hubbard, female    DOB: 06-24-1965,   MRN: [id number]  No chief complaint on file.   57 y.o. female presents for concern of ulceration on the left second toe. She has been keeping neosporin and a bandaid on the area. 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 Relates drainage to the area but has been improving. Was concerned for infection.   Relates burning and tingling in their feet. Patient is diabetic and last A1c was No results found for: "HGBA1C" .   PCP:  Clayborn Heron, MD    . Denies any other pedal complaints. Denies n/v/f/c.   Past Medical History:  Diagnosis Date   Anemia    Autoimmune disorder (HCC)    Diabetes mellitus without complication (HCC)    Family history of BRCA2 gene positive    Family history of breast cancer    Hyperlipidemia    Hypertension    Morbid obesity (HCC)    Neuropathy    Peripheral edema    chronic   Type 1 diabetes (HCC)    Ulcerative colitis (HCC)     Objective:  Physical Exam: Vascular: DP/PT pulses 2/4 bilateral. CFT <3 seconds. Normal hair growth on digits. No edema.  Skin. No lacerations or abrasions bilateral feet. Hyperkeratotic lesion with near ulceration noted to medial left second digit. No erythema edema or purulence noted. Distal hyperkeratotic lesion noted to left third digit.  Musculoskeletal: MMT 5/5 bilateral lower extremities in DF, PF, Inversion and Eversion. Deceased ROM in DF of ankle joint.  Neurological: Sensation intact to light touch. Protective sensation absent.    Assessment:   1. Diabetes mellitus type 2 with neurological manifestations (HCC)   2. Pre-ulcerative calluses      Plan:  Patient was evaluated and treated and all questions answered. Pre-ulcerative lesion on left second toe and distal third digit on left. Appear to have healed.  -Debridement of hyperkeratotic tissue with no underlying ulceration  -Off-loading with  surgical shoe. -No abx indicated.  -Discussed glucose control and proper protein-rich diet.  -Discussed if any worsening redness, pain, fever or chills to call or may need to report to the emergency room. Patient expressed understanding.   Return in 4 weeks for recheck of the area to make sure remains healed.   Return in about 4 weeks (around 11/14/2022) for wound check.   Louann Sjogren, DPM

## 2022-11-14 ENCOUNTER — Ambulatory Visit (INDEPENDENT_AMBULATORY_CARE_PROVIDER_SITE_OTHER): Payer: Medicaid Other | Admitting: Podiatry

## 2022-11-14 DIAGNOSIS — Z91199 Patient's noncompliance with other medical treatment and regimen due to unspecified reason: Secondary | ICD-10-CM

## 2022-11-14 NOTE — Progress Notes (Signed)
No show

## 2023-02-14 ENCOUNTER — Ambulatory Visit: Payer: Medicaid Other | Admitting: Podiatry

## 2023-02-20 ENCOUNTER — Ambulatory Visit: Payer: Medicaid Other | Admitting: Podiatry

## 2023-02-20 DIAGNOSIS — E1149 Type 2 diabetes mellitus with other diabetic neurological complication: Secondary | ICD-10-CM | POA: Diagnosis not present

## 2023-02-20 DIAGNOSIS — L97522 Non-pressure chronic ulcer of other part of left foot with fat layer exposed: Secondary | ICD-10-CM

## 2023-02-20 NOTE — Progress Notes (Signed)
  Subjective:  Patient ID: Angela Hubbard, female    DOB: 06-22-1965,   MRN: [id number]  No chief complaint on file.   58 y.o. female presents for follow-up of ulceration of left second toe. Relates she was doing fine but recently saw endocrinologist and told to follow-up here. Never followed up as scheduled previously.  Was concerned for infection.   Relates burning and tingling in their feet. She has been dealing with sinus fungal infection.  Patient is diabetic and last A1c was No results found for: "HGBA1C" .   PCP:  Clayborn Heron, MD    . Denies any other pedal complaints. Denies n/v/f/c.   Past Medical History:  Diagnosis Date   Anemia    Autoimmune disorder (HCC)    Diabetes mellitus without complication (HCC)    Family history of BRCA2 gene positive    Family history of breast cancer    Hyperlipidemia    Hypertension    Morbid obesity (HCC)    Neuropathy    Peripheral edema    chronic   Type 1 diabetes (HCC)    Ulcerative colitis (HCC)     Objective:  Physical Exam: Vascular: DP/PT pulses 2/4 bilateral. CFT <3 seconds. Normal hair growth on digits. No edema.  Skin. No lacerations or abrasions bilateral feet. Hyperkeratotic lesion now with ulceration to medial second digit down to fat layer. About 0.1 cm x 0.2 cm x 0.2 cm  No erythema edema or purulence noted. Distal hyperkeratotic lesion noted to left third digit. Nails 1-3 are thickened and elongated on left.  Musculoskeletal: MMT 5/5 bilateral lower extremities in DF, PF, Inversion and Eversion. Deceased ROM in DF of ankle joint.  Neurological: Sensation intact to light touch. Protective sensation absent.    Assessment:   1. Ulcer of left foot with fat layer exposed (HCC)   2. Diabetes mellitus type 2 with neurological manifestations [facility])       Plan:  Patient was evaluated and treated and all questions answered. Ulcer left medial second toe with fat layer exposed  -Debridement as below. -Dressed with  iodosorb, DSD. -Off-loading with surgical shoe. Dispensed.  -No abx indicated. Currently on antifungal to help with fungal sinus infection.  -Discussed glucose control and proper protein-rich diet.  -Discussed if any worsening redness, pain, fever or chills to call or may need to report to the emergency room. Patient expressed understanding.   Procedure: Excisional Debridement of Wound Rationale: Removal of non-viable soft tissue from the wound to promote healing.  Anesthesia: none Pre-Debridement Wound Measurements: Overlying callus   Post-Debridement Wound Measurements: 0.1 cm x 0.2 cm x 0.2 cm  Type of Debridement: Sharp Excisional Tissue Removed: Non-viable soft tissue Depth of Debridement: subcutaneous tissue. Technique: Sharp excisional debridement to bleeding, viable wound base.  Dressing: Dry, sterile, compression dressing. Disposition: Patient tolerated procedure well. Patient to return in 2 week for follow-up.  No follow-ups on file.   No follow-ups on file.   Louann Sjogren, DPM

## 2023-03-07 ENCOUNTER — Ambulatory Visit: Payer: Medicaid Other | Admitting: Podiatry

## 2023-03-13 ENCOUNTER — Encounter: Payer: Self-pay | Admitting: Podiatry

## 2023-03-13 ENCOUNTER — Ambulatory Visit: Payer: Medicaid Other | Admitting: Podiatry

## 2023-03-13 DIAGNOSIS — E1149 Type 2 diabetes mellitus with other diabetic neurological complication: Secondary | ICD-10-CM

## 2023-03-13 DIAGNOSIS — B351 Tinea unguium: Secondary | ICD-10-CM

## 2023-03-13 DIAGNOSIS — L84 Corns and callosities: Secondary | ICD-10-CM

## 2023-03-13 NOTE — Progress Notes (Signed)
  Subjective:  Patient ID: Angela Hubbard, female    DOB: 1965/12/02,   MRN: [id number]  No chief complaint on file.   58 y.o. female presents for follow-up of ulceration of left second toe.  Relates dressing as instructed and doing well.  She does relate continued concern for fungal nails.   Relates burning and tingling in their feet. She has been dealing with sinus fungal infection.  Patient is diabetic and last A1c was No results found for: "HGBA1C" .   PCP:  Clayborn Heron, MD    . Denies any other pedal complaints. Denies n/v/f/c.   Past Medical History:  Diagnosis Date   Anemia    Autoimmune disorder (HCC)    Diabetes mellitus without complication (HCC)    Family history of BRCA2 gene positive    Family history of breast cancer    Hyperlipidemia    Hypertension    Morbid obesity (HCC)    Neuropathy    Peripheral edema    chronic   Type 1 diabetes (HCC)    Ulcerative colitis (HCC)     Objective:  Physical Exam: Vascular: DP/PT pulses 2/4 bilateral. CFT <3 seconds. Normal hair growth on digits. No edema.  Skin. No lacerations or abrasions bilateral feet. Hyperkeratotic lesion medial second digit with underlying ulceration healed.   No erythema edema or purulence noted. Distal hyperkeratotic lesion noted to left third digit. Nails 1-3 are thickened and elongated on left. Some changes starting on left hallux and second digit.  Musculoskeletal: MMT 5/5 bilateral lower extremities in DF, PF, Inversion and Eversion. Deceased ROM in DF of ankle joint.  Neurological: Sensation intact to light touch. Protective sensation absent.    Assessment:   1. Pre-ulcerative calluses   2. Diabetes mellitus type 2 with neurological manifestations (HCC)   3. Onychomycosis        Plan:  Patient was evaluated and treated and all questions answered. Ulcer left medial second toe healed -Debridement of hyperkeratotic tissue as courtesy with underlying ulceration healed.  -Dressed with  Band-Aid. . -May return to regular shoe and use toe cap for padding. Dispensed. .  -Discussed glucose control and proper protein-rich diet.  -Discussed if any worsening redness, pain, fever or chills to call or may need to report to the emergency room. Patient expressed understanding.  -Examined patient -Discussed treatment options for painful dystrophic nails  -Clinical picture and Fungal culture was obtained by removing a portion of the hard nail itself from each of the involved toenails using a sterile nail nipper and sent to [facility] lab. Patient tolerated the biopsy procedure well without discomfort or need for anesthesia.  -Discussed fungal nail treatment options including oral, topical, and laser treatments.  -Patient to return in 4 weeks for follow up evaluation and discussion of fungal culture results or sooner if symptoms worsen.   Return in 4 weeks for wound check   Return in about 4 weeks (around 04/10/2023) for fungal nail, wound check.   Return in about 4 weeks (around 04/10/2023) for fungal nail, wound check.   Louann Sjogren, DPM

## 2023-03-13 NOTE — Addendum Note (Signed)
 Addended by: Daryel November on: 03/13/2023 01:24 PM   Modules accepted: Orders

## 2023-03-24 ENCOUNTER — Ambulatory Visit (HOSPITAL_COMMUNITY): Payer: Medicaid Other | Admitting: Licensed Clinical Social Worker

## 2023-03-24 ENCOUNTER — Other Ambulatory Visit: Payer: Self-pay | Admitting: Podiatry

## 2023-04-11 ENCOUNTER — Encounter: Payer: Self-pay | Admitting: Plastic Surgery

## 2023-04-11 ENCOUNTER — Ambulatory Visit: Payer: Medicaid Other | Admitting: Podiatry

## 2023-04-11 ENCOUNTER — Ambulatory Visit: Payer: Self-pay | Admitting: Plastic Surgery

## 2023-04-11 VITALS — BP 142/66 | HR 102 | Ht 67.0 in | Wt 225.0 lb

## 2023-04-11 DIAGNOSIS — E10610 Type 1 diabetes mellitus with diabetic neuropathic arthropathy: Secondary | ICD-10-CM | POA: Diagnosis not present

## 2023-04-11 DIAGNOSIS — Z91199 Patient's noncompliance with other medical treatment and regimen due to unspecified reason: Secondary | ICD-10-CM

## 2023-04-11 DIAGNOSIS — F411 Generalized anxiety disorder: Secondary | ICD-10-CM | POA: Diagnosis not present

## 2023-04-11 DIAGNOSIS — M542 Cervicalgia: Secondary | ICD-10-CM | POA: Diagnosis not present

## 2023-04-11 DIAGNOSIS — Z9884 Bariatric surgery status: Secondary | ICD-10-CM

## 2023-04-11 DIAGNOSIS — R21 Rash and other nonspecific skin eruption: Secondary | ICD-10-CM

## 2023-04-11 DIAGNOSIS — M549 Dorsalgia, unspecified: Secondary | ICD-10-CM

## 2023-04-11 NOTE — Progress Notes (Signed)
 Patient ID: Angela Hubbard, female    DOB: 1966/01/05, 58 y.o.   MRN: [id number]   Chief Complaint  Patient presents with   Advice Only   Skin Problem    The patient is a 58 year old female here for evaluation of her abdomen.  The patient was around 500 pounds several years ago and lost significant weight to the point where she is now 225 pounds.  She is 5 feet 7 inches tall.  She had a gastric bypass done in 2017 and a gallbladder removed in 2019.  She has pretty brittle diabetes and has recently gotten a monitor.  This seems to be helping her regulate her blood sugars a little bit better.  She complains of back and neck pain due to the excess weight that is hanging over her abdomen pannus area.  She is interested in panniculectomy.  She does have some skin breakdown.  She has tried lotions and creams but constantly has odor and skin breakdown due to the rashes.    Review of Systems  Constitutional: Negative.   HENT: Negative.    Eyes: Negative.   Respiratory: Negative.    Cardiovascular: Negative.   Gastrointestinal: Negative.   Endocrine: Negative.   Genitourinary: Negative.   Musculoskeletal:  Positive for back pain and neck pain.  Skin:  Positive for rash.    Past Medical History:  Diagnosis Date   Anemia    Autoimmune disorder (HCC)    Diabetes mellitus without complication (HCC)    Family history of BRCA2 gene positive    Family history of breast cancer    Hyperlipidemia    Hypertension    Morbid obesity (HCC)    Neuropathy    Peripheral edema    chronic   Type 1 diabetes (HCC)    Ulcerative colitis (HCC)     Past Surgical History:  Procedure Laterality Date   CHOLECYSTECTOMY        Current Outpatient Medications:    Accu-Chek FastClix Lancets MISC, U TO MONITOR BG TID, Disp: , Rfl:    albuterol (PROVENTIL HFA;VENTOLIN HFA) 108 (90 Base) MCG/ACT inhaler, Inhale into the lungs., Disp: , Rfl:    amitriptyline (ELAVIL) 50 MG tablet, Take by mouth., Disp: ,  Rfl:    atorvastatin (LIPITOR) 40 MG tablet, Take 40 mg by mouth daily., Disp: , Rfl:    BD PEN NEEDLE NANO U/F 32G X 4 MM MISC, U BID, Disp: , Rfl:    Blood Glucose Monitoring Suppl (ACCU-CHEK GUIDE ME) w/Device KIT, by Does not apply route., Disp: , Rfl:    cephALEXin (KEFLEX) 500 MG capsule, TAKE 1 CAPSULE(500 MG) BY MOUTH TWICE DAILY, Disp: 6 capsule, Rfl: 0   cephALEXin (KEFLEX) 500 MG capsule, Take 1 capsule (500 mg total) by mouth 2 (two) times daily., Disp: 6 capsule, Rfl: 0   clobetasol (TEMOVATE) 0.05 % external solution, APP EXT AA WITH LESIONS BID, Disp: , Rfl:    cyclobenzaprine (FLEXERIL) 10 MG tablet, , Disp: , Rfl:    fluticasone (FLONASE) 50 MCG/ACT nasal spray, , Disp: , Rfl:    gemfibrozil (LOPID) 600 MG tablet, Take 600 mg by mouth 2 (two) times daily before a meal., Disp: , Rfl:    glucose blood (ACCU-CHEK AVIVA PLUS) test strip, U 1 STRIP TID, Disp: , Rfl:    HYDROcodone-acetaminophen (NORCO/VICODIN) 5-325 MG tablet, TK 1 T PO Q 8 H PRF SEVERE PAIN, Disp: , Rfl:    LANTUS SOLOSTAR 100 UNIT/ML Solostar Pen, INJ 30  UNI [place] HS. DISCONTINUE HUMALOG, Disp: , Rfl:    levothyroxine (SYNTHROID, LEVOTHROID) 75 MCG tablet, TK 1 T PO QD IN THE MORNING OES, Disp: , Rfl:    liraglutide (VICTOZA) 18 MG/3ML SOPN, INJECT 1.8MG  UNDER THE SKIN DAILY, Disp: , Rfl:    metFORMIN (GLUCOPHAGE) 1000 MG tablet, Take by mouth., Disp: , Rfl:    Multiple Vitamin (MULTI-VITAMIN) tablet, Take by mouth., Disp: , Rfl:    Multiple Vitamins-Iron TABS, Take by mouth., Disp: , Rfl:    Multiple Vitamins-Minerals (MULTIVITAMIN ADULT PO), Take by mouth., Disp: , Rfl:    neomycin-polymyxin-hydrocortisone (CORTISPORIN) OTIC solution, APPLY 1-2 DROPS TO TOE BID AFTER SOAKING, Disp: , Rfl:    nystatin (MYCOSTATIN/NYSTOP) powder, APP EXT AA BID, Disp: , Rfl:    omeprazole (PRILOSEC) 40 MG capsule, Take 40 mg by mouth daily., Disp: , Rfl:    pregabalin (LYRICA) 150 MG capsule, Take by mouth., Disp: , Rfl:     terbinafine (LAMISIL) 250 MG tablet, TAKE 1 TABLET(250 MG) BY MOUTH DAILY, Disp: 30 tablet, Rfl: 0   terbinafine (LAMISIL) 250 MG tablet, Take 1 tablet (250 mg total) by mouth daily., Disp: 30 tablet, Rfl: 1   Objective:   Vitals:   04/11/23 0823  BP: (!) 142/66  Pulse: (!) 102  SpO2: 98%    Physical Exam Vitals and nursing note reviewed.  Constitutional:      Appearance: Normal appearance.  HENT:     Head: Atraumatic.  Cardiovascular:     Rate and Rhythm: Normal rate.     Pulses: Normal pulses.  Pulmonary:     Effort: Pulmonary effort is normal.  Abdominal:     General: There is no distension.     Palpations: Abdomen is soft.  Skin:    General: Skin is warm.     Capillary Refill: Capillary refill takes less than 2 seconds.  Neurological:     Mental Status: She is alert and oriented to person, place, and time.  Psychiatric:        Mood and Affect: Mood normal.        Behavior: Behavior normal.        Thought Content: Thought content normal.        Judgment: Judgment normal.     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  We need to get her hemoglobin A1c better regulated preferably below 7.  She is actively working on this so I will plan to see her back in 6 months and we will reevaluate.  Pictures were obtained of the patient and placed in the chart with the patient's or guardian's permission.   Alena Bills Gavin Telford, DO

## 2023-04-11 NOTE — Progress Notes (Signed)
 No show

## 2023-04-17 ENCOUNTER — Ambulatory Visit: Admitting: Podiatry

## 2023-04-17 ENCOUNTER — Encounter: Payer: Self-pay | Admitting: Podiatry

## 2023-04-17 DIAGNOSIS — L84 Corns and callosities: Secondary | ICD-10-CM | POA: Diagnosis not present

## 2023-04-17 DIAGNOSIS — B351 Tinea unguium: Secondary | ICD-10-CM

## 2023-04-17 DIAGNOSIS — M79674 Pain in right toe(s): Secondary | ICD-10-CM

## 2023-04-17 DIAGNOSIS — M79675 Pain in left toe(s): Secondary | ICD-10-CM | POA: Diagnosis not present

## 2023-04-17 DIAGNOSIS — E1149 Type 2 diabetes mellitus with other diabetic neurological complication: Secondary | ICD-10-CM

## 2023-04-17 NOTE — Progress Notes (Signed)
  Subjective:  Patient ID: Angela Hubbard, female    DOB: 01-May-1965,   MRN: [id number]  No chief complaint on file.   58 y.o. female presents for follow-up of ulceration of left second toe. Relates doing well.   Relates burning and tingling in their feet. She has been dealing with sinus fungal infection.  Patient is diabetic and last A1c was No results found for: "HGBA1C" .   PCP:  Clayborn Heron, MD    . Denies any other pedal complaints. Denies n/v/f/c.   Past Medical History:  Diagnosis Date   Anemia    Autoimmune disorder (HCC)    Diabetes mellitus without complication (HCC)    Family history of BRCA2 gene positive    Family history of breast cancer    Hyperlipidemia    Hypertension    Morbid obesity (HCC)    Neuropathy    Peripheral edema    chronic   Type 1 diabetes (HCC)    Ulcerative colitis (HCC)     Objective:  Physical Exam: Vascular: DP/PT pulses 2/4 bilateral. CFT <3 seconds. Normal hair growth on digits. No edema.  Skin. No lacerations or abrasions bilateral feet. Hyperkeratotic lesion medial second digit with underlying ulceration healed.   No erythema edema or purulence noted. Distal hyperkeratotic lesion noted to left third digit. Nails 1-3 are thickened and elongated on left. Some changes starting on left hallux and second digit.  Musculoskeletal: MMT 5/5 bilateral lower extremities in DF, PF, Inversion and Eversion. Deceased ROM in DF of ankle joint.  Neurological: Sensation intact to light touch. Protective sensation absent.    Assessment:   1. Pre-ulcerative calluses   2. Diabetes mellitus type 2 with neurological manifestations (HCC)   3. Pain due to onychomycosis of toenails of both feet         Plan:  Patient was evaluated and treated and all questions answered. Ulcer left medial second toe healed -Debridement of hyperkeratotic tissue without ulceration underlying with chisel and without incidetn. -Discussed and educated patient on  diabetic foot care, especially with  regards to the vascular, neurological and musculoskeletal systems.  -Stressed the importance of good glycemic control and the detriment of not  controlling glucose levels in relation to the foot. -Discussed supportive shoes at all times and checking feet regularly.  -Mechanically debrided all nails 1-5 bilateral using sterile nail nipper and filed with dremel without incident  -May return to regular shoe and use toe cap for padding. Dispensed. .  -Discussed glucose control and proper protein-rich diet.  -Discussed if any worsening redness, pain, fever or chills to call or may need to report to the emergency room. Patient expressed understanding.  -Examined patient -Discussed treatment options for painful dystrophic nails  -Culture negative for fungus in nails and likely just damage underlying nails.  -Discussed plan to just continue routine care.  -Patient to return in 3 months for rfc.    Return in about 3 months (around 07/18/2023) for rfc.   Return in about 3 months (around 07/18/2023) for rfc.   Louann Sjogren, DPM

## 2023-07-18 ENCOUNTER — Ambulatory Visit (INDEPENDENT_AMBULATORY_CARE_PROVIDER_SITE_OTHER): Admitting: Podiatry

## 2023-07-18 DIAGNOSIS — Z91199 Patient's noncompliance with other medical treatment and regimen due to unspecified reason: Secondary | ICD-10-CM

## 2023-07-18 NOTE — Progress Notes (Signed)
 No show

## 2023-07-24 ENCOUNTER — Encounter: Payer: Self-pay | Admitting: Podiatry

## 2023-07-24 ENCOUNTER — Ambulatory Visit: Admitting: Podiatry

## 2023-07-24 DIAGNOSIS — M79674 Pain in right toe(s): Secondary | ICD-10-CM

## 2023-07-24 DIAGNOSIS — M79675 Pain in left toe(s): Secondary | ICD-10-CM | POA: Diagnosis not present

## 2023-07-24 DIAGNOSIS — E1149 Type 2 diabetes mellitus with other diabetic neurological complication: Secondary | ICD-10-CM | POA: Diagnosis not present

## 2023-07-24 DIAGNOSIS — B351 Tinea unguium: Secondary | ICD-10-CM

## 2023-07-24 NOTE — Progress Notes (Signed)
  Subjective:  Patient ID: Angela Hubbard, female    DOB: September 28, 1965,   MRN: [id number]  Chief Complaint  Patient presents with   Diabetes    My nails are bad.  One of them is falling off.  They're turning black.  Saw Dr. Duwaine Clover - 07/21/2023; A1c 7.3    58 y.o. female presents for follow-up of ulceration of left second toe. Relates doing well.   Relates burning and tingling in their feet. She has been dealing with sinus fungal infection. Hoping to have nails and callus trimmed.  Patient is diabetic and last A1c was No results found for: HGBA1C .   PCP:  Rankins, Richerd SAUNDERS, MD    . Denies any other pedal complaints. Denies n/v/f/c.   Past Medical History:  Diagnosis Date   Anemia    Autoimmune disorder (HCC)    Diabetes mellitus without complication (HCC)    Family history of BRCA2 gene positive    Family history of breast cancer    Hyperlipidemia    Hypertension    Morbid obesity (HCC)    Neuropathy    Peripheral edema    chronic   Type 1 diabetes (HCC)    Ulcerative colitis (HCC)     Objective:  Physical Exam: Vascular: DP/PT pulses 2/4 bilateral. CFT <3 seconds. Normal hair growth on digits. No edema.  Skin. No lacerations or abrasions bilateral feet. Hyperkeratotic lesion medial second digit with underlying ulceration healed.   No erythema edema or purulence noted. Distal hyperkeratotic lesion noted to left third digit. Nails 1-3 are thickened and elongated on left. Some changes starting on left hallux and second digit.  Musculoskeletal: MMT 5/5 bilateral lower extremities in DF, PF, Inversion and Eversion. Deceased ROM in DF of ankle joint.  Neurological: Sensation intact to light touch. Protective sensation absent.    Assessment:   1. Pain due to onychomycosis of toenails of both feet   2. Diabetes mellitus type 2 with neurological manifestations (HCC)          Plan:  Patient was evaluated and treated and all questions answered. -Debridement of  hyperkeratotic tissue without ulceration underlying with chisel and without incidetn. -Discussed and educated patient on diabetic foot care, especially with  regards to the vascular, neurological and musculoskeletal systems.  -Stressed the importance of good glycemic control and the detriment of not  controlling glucose levels in relation to the foot. -Discussed supportive shoes at all times and checking feet regularly.  -Mechanically debrided all nails 1-5 bilateral using sterile nail nipper and filed with dremel without incident  -May return to regular shoe and use toe cap for padding. Dispensed. .  -Discussed glucose control and proper protein-rich diet.  -Discussed if any worsening redness, pain, fever or chills to call or may need to report to the emergency room. Patient expressed understanding.  -Examined patient -Discussed treatment options for painful dystrophic nails  -Culture negative for fungus in nails and likely just damage underlying nails.  -Discussed plan to just continue routine care.  -Patient to return in 3 months for rfc.    Return in about 3 months (around 10/24/2023) for rfc.   Return in about 3 months (around 10/24/2023) for rfc.   Asberry Failing, DPM

## 2023-09-04 ENCOUNTER — Telehealth: Payer: Self-pay

## 2023-09-04 DIAGNOSIS — E1142 Type 2 diabetes mellitus with diabetic polyneuropathy: Secondary | ICD-10-CM

## 2023-10-14 ENCOUNTER — Ambulatory Visit: Admitting: Plastic Surgery

## 2023-10-23 ENCOUNTER — Encounter: Payer: Self-pay | Admitting: Plastic Surgery

## 2023-10-24 ENCOUNTER — Ambulatory Visit: Admitting: Podiatry

## 2023-10-28 ENCOUNTER — Ambulatory Visit: Admitting: Student

## 2023-10-28 DIAGNOSIS — M793 Panniculitis, unspecified: Secondary | ICD-10-CM

## 2023-10-28 DIAGNOSIS — R21 Rash and other nonspecific skin eruption: Secondary | ICD-10-CM

## 2023-10-28 NOTE — Progress Notes (Unsigned)
   Referring Provider Rankins, Richerd SAUNDERS, MD [address] [place],  [place] [address]   CC: No chief complaint on file.     Angela Hubbard is an 58 y.o. female.  HPI: Is a 58 year old female who presents to the clinic today for follow-up on her A1c for consideration of panniculectomy surgery.  Patient was seen for initial consult by Dr. Lowery on 04/11/2023.  At this visit, patient reported that she had significant weight loss.  She reported that she had diabetes as well.  She complained of back and neck pain due to the excess weight that was hanging over her abdomen and pannus area.  She was interested in panniculectomy.  There was some skin breakdown.  It was noted that patient would do very well with a panniculectomy, but hemoglobin A1c would need to be better regulated, preferably below 7.  The plan was for her to follow back up in 6 months for reevaluation.  Per chart review,  Review of Systems General: ***  Physical Exam    04/11/2023    8:23 AM 04/13/2018    9:28 AM 02/09/2018    3:32 PM  Vitals with BMI  Height 5' 7    Weight 225 lbs    BMI 35.23    Systolic 142 112 885  Diastolic 66 70 73  Pulse 102 90 88    General:  No acute distress,  Alert and oriented, Non-Toxic, Normal speech and affect ***   Assessment/Plan ***  Angela Hubbard 10/28/2023, 1:42 PM

## 2023-10-30 ENCOUNTER — Ambulatory Visit (INDEPENDENT_AMBULATORY_CARE_PROVIDER_SITE_OTHER): Admitting: Podiatry

## 2023-10-30 DIAGNOSIS — Z91199 Patient's noncompliance with other medical treatment and regimen due to unspecified reason: Secondary | ICD-10-CM

## 2023-10-30 NOTE — Progress Notes (Signed)
 Cancel 24 hours-fall

## 2023-11-26 ENCOUNTER — Ambulatory Visit: Admitting: Student

## 2023-11-26 DIAGNOSIS — E10610 Type 1 diabetes mellitus with diabetic neuropathic arthropathy: Secondary | ICD-10-CM

## 2023-11-26 NOTE — Progress Notes (Signed)
 Attempted to call patient at time of her appointment, she did not answer.  Left voicemail to return call

## 2023-12-11 ENCOUNTER — Ambulatory Visit: Admitting: Student

## 2023-12-11 DIAGNOSIS — E10610 Type 1 diabetes mellitus with diabetic neuropathic arthropathy: Secondary | ICD-10-CM

## 2023-12-11 DIAGNOSIS — M793 Panniculitis, unspecified: Secondary | ICD-10-CM

## 2023-12-11 NOTE — Progress Notes (Signed)
 Referring Provider Rankins, Richerd SAUNDERS, MD [address] [place],  [place] [address]   CC: Follow up     Angela Hubbard is an 58 y.o. female.  HPI: Is a 58 year old female who presents to the clinic today for follow-up on her A1c for consideration of panniculectomy surgery.   Patient was seen for initial consult by Dr. Lowery on 04/11/2023.  At this visit, patient reported that she had significant weight loss.  She reported that she had diabetes as well.  She complained of back and neck pain due to the excess weight that was hanging over her abdomen and pannus area.  She was interested in panniculectomy.  There was some skin breakdown.  It was noted that patient would do very well with a panniculectomy, but hemoglobin A1c would need to be better regulated, preferably below 7.  The plan was for her to follow back up in 6 months for reevaluation.  Patient was then seen in the clinic again on 10/28/2023.  At this visit, she reported that her A1c had been 7.6 back in June.  She also had reported that she recently fell and had been going to urgent cares for issues since she fell.  Patient was still wanting to move forward with panniculectomy surgery.  It was discussed with her that we have to follow back up to review her most recent A1c and see if it was below 7.  It was also discussed with her that she would need to follow-up with her PCP about her recent fall and those issues would need to be resolved prior to surgery.  Today, patient reports that she is doing well.  She states that she is completely recovered from the fall that she experienced before her last appointment.  She also states that she recently saw her endocrinologist.  She states that her A1c was around 7.6, she cannot give me an exact number.  She states that her endocrinologist at [facility] physician this is typically where the patient is going to be around in terms of A1c.  Patient states that her A1c has been under 7.0 one time in  her life.  She denies any other changes in her health.  She denies any recent fevers or chills.  Patient states that she would like to proceed with panniculectomy.   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 Lowery about the possibility of proceeding with surgery, but I discussed with her that it will be up to Dr. Lowery and that she may not want to proceed with surgery given elevated A1c.  I discussed with her the risks of wound healing complications due to elevated A1c.  Patient expressed understanding.  Discussed with the patient to work on lowering her sugars and her A1c in the meantime.  Will tentatively plan to have patient follow-up with Dr. Lowery in about 3 months to further discuss.  The patient gave consent to have this visit done by telemedicine / virtual visit, two identifiers were used to identify patient. This is also consent for access the chart and treat the patient via this visit. The patient is located at home.  I, the provider, am at the office.  We spent 7 minutes together for the visit.  Joined by telephone.   Angela Hubbard 12/11/2023, 10:41  AM

## 2024-03-09 ENCOUNTER — Telehealth: Admitting: Plastic Surgery
